# Patient Record
Sex: Female | Born: 1960 | Race: White | Hispanic: No | Smoking: Current every day smoker
Health system: Southern US, Community
[De-identification: ages and names within clinical notes are randomized; demographics above are authoritative.]

## PROBLEM LIST (undated history)

## (undated) DIAGNOSIS — F329 Major depressive disorder, single episode, unspecified: Secondary | ICD-10-CM

## (undated) DIAGNOSIS — R3129 Other microscopic hematuria: Secondary | ICD-10-CM

## (undated) DIAGNOSIS — D649 Anemia, unspecified: Secondary | ICD-10-CM

## (undated) DIAGNOSIS — B009 Herpesviral infection, unspecified: Secondary | ICD-10-CM

## (undated) DIAGNOSIS — F419 Anxiety disorder, unspecified: Secondary | ICD-10-CM

## (undated) DIAGNOSIS — F32A Depression, unspecified: Secondary | ICD-10-CM

## (undated) DIAGNOSIS — O24419 Gestational diabetes mellitus in pregnancy, unspecified control: Secondary | ICD-10-CM

## (undated) DIAGNOSIS — K219 Gastro-esophageal reflux disease without esophagitis: Secondary | ICD-10-CM

## (undated) DIAGNOSIS — J189 Pneumonia, unspecified organism: Secondary | ICD-10-CM

## (undated) HISTORY — DX: Major depressive disorder, single episode, unspecified: F32.9

## (undated) HISTORY — PX: WISDOM TOOTH EXTRACTION: SHX21

## (undated) HISTORY — DX: Depression, unspecified: F32.A

## (undated) HISTORY — DX: Gestational diabetes mellitus in pregnancy, unspecified control: O24.419

## (undated) HISTORY — DX: Other microscopic hematuria: R31.29

## (undated) HISTORY — DX: Herpesviral infection, unspecified: B00.9

---

## 1994-02-03 HISTORY — PX: CERVICAL BIOPSY: SHX590

## 1995-02-04 HISTORY — PX: OTHER SURGICAL HISTORY: SHX169

## 2002-11-23 ENCOUNTER — Other Ambulatory Visit: Admission: RE | Admit: 2002-11-23 | Discharge: 2002-11-23 | Payer: Self-pay | Admitting: Obstetrics and Gynecology

## 2002-12-21 ENCOUNTER — Ambulatory Visit (HOSPITAL_COMMUNITY): Admission: RE | Admit: 2002-12-21 | Discharge: 2002-12-21 | Payer: Self-pay | Admitting: Obstetrics and Gynecology

## 2003-12-12 ENCOUNTER — Other Ambulatory Visit: Admission: RE | Admit: 2003-12-12 | Discharge: 2003-12-12 | Payer: Self-pay | Admitting: Obstetrics and Gynecology

## 2004-01-02 ENCOUNTER — Ambulatory Visit (HOSPITAL_COMMUNITY): Admission: RE | Admit: 2004-01-02 | Discharge: 2004-01-02 | Payer: Self-pay | Admitting: Obstetrics and Gynecology

## 2005-01-15 ENCOUNTER — Other Ambulatory Visit: Admission: RE | Admit: 2005-01-15 | Discharge: 2005-01-15 | Payer: Self-pay | Admitting: Obstetrics and Gynecology

## 2005-02-07 ENCOUNTER — Ambulatory Visit (HOSPITAL_COMMUNITY): Admission: RE | Admit: 2005-02-07 | Discharge: 2005-02-07 | Payer: Self-pay | Admitting: Obstetrics and Gynecology

## 2006-02-03 HISTORY — PX: ABLATION: SHX5711

## 2006-02-11 ENCOUNTER — Ambulatory Visit (HOSPITAL_COMMUNITY): Admission: RE | Admit: 2006-02-11 | Discharge: 2006-02-11 | Payer: Self-pay | Admitting: Obstetrics & Gynecology

## 2006-03-26 ENCOUNTER — Other Ambulatory Visit: Admission: RE | Admit: 2006-03-26 | Discharge: 2006-03-26 | Payer: Self-pay | Admitting: Obstetrics & Gynecology

## 2006-05-25 ENCOUNTER — Ambulatory Visit (HOSPITAL_BASED_OUTPATIENT_CLINIC_OR_DEPARTMENT_OTHER): Admission: RE | Admit: 2006-05-25 | Discharge: 2006-05-25 | Payer: Self-pay | Admitting: Obstetrics & Gynecology

## 2006-05-25 HISTORY — PX: NOVASURE ABLATION: SHX5394

## 2007-04-02 ENCOUNTER — Ambulatory Visit (HOSPITAL_COMMUNITY): Admission: RE | Admit: 2007-04-02 | Discharge: 2007-04-02 | Payer: Self-pay | Admitting: Obstetrics & Gynecology

## 2007-10-29 ENCOUNTER — Other Ambulatory Visit: Admission: RE | Admit: 2007-10-29 | Discharge: 2007-10-29 | Payer: Self-pay | Admitting: Obstetrics & Gynecology

## 2008-04-03 ENCOUNTER — Ambulatory Visit (HOSPITAL_COMMUNITY): Admission: RE | Admit: 2008-04-03 | Discharge: 2008-04-03 | Payer: Self-pay | Admitting: Obstetrics & Gynecology

## 2009-05-04 ENCOUNTER — Ambulatory Visit (HOSPITAL_COMMUNITY): Admission: RE | Admit: 2009-05-04 | Discharge: 2009-05-04 | Payer: Self-pay | Admitting: Obstetrics & Gynecology

## 2009-06-03 DIAGNOSIS — B009 Herpesviral infection, unspecified: Secondary | ICD-10-CM

## 2009-06-03 HISTORY — DX: Herpesviral infection, unspecified: B00.9

## 2009-08-23 ENCOUNTER — Ambulatory Visit: Payer: Self-pay | Admitting: Diagnostic Radiology

## 2009-08-23 ENCOUNTER — Ambulatory Visit (HOSPITAL_BASED_OUTPATIENT_CLINIC_OR_DEPARTMENT_OTHER): Admission: RE | Admit: 2009-08-23 | Discharge: 2009-08-23 | Payer: Self-pay | Admitting: Family Medicine

## 2010-06-21 NOTE — Op Note (Signed)
[name], April Cook                ACCOUNT NO.:  [id number]   MEDICAL RECORD NO.:  [id number]          PATIENT TYPE:  AMB   LOCATION:  [facility]                         FACILITY:  [facility]   PHYSICIAN:  M. Leda Quail, MD  DATE OF BIRTH:  [birth year]   DATE OF PROCEDURE:  DATE OF DISCHARGE:                               OPERATIVE REPORT   PREOPERATIVE DIAGNOSES:  105. 50 year old gravida 2, para 2 married white female with      menorrhagia.  2. Preoperative endometrial biopsy which showed benign proliferative      endometrium.   POSTOPERATIVE DIAGNOSES:  73. 50 year old gravida 2, para 2 married white female with      menorrhagia.  2. Preoperative endometrial biopsy which showed benign proliferative      endometrium.   PROCEDURE:  NovaSure ablation.   SURGEON:  M. Leda Quail, M.D.   ASSISTANT:  OR staff.   ANESTHESIA:  MAC.   ANESTHESIOLOGIST:  Dr. Leta Jungling was the anesthesiologist overseeing the  case.   SPECIMENS:  None.   ESTIMATED BLOOD LOSS:  Minimal.   FLUIDS:  1000 mL of LR.   URINE OUTPUT:  50 mL.   DEFICIT:  10 mL of LR.   INDICATIONS:  Ms. April Cook is a 50 year old G2, P2 married white female  who has history of menorrhagia.  She has undergone a workup including a  sonohystogram and an endometrial biopsy.  Sonohystogram showed a 9.2 x  3.6 x 4.5-cm uterus with some small intramural fibroids.  She did have  endometrial biopsy which did not show any pathology.  She has been  counseled about options including birth control pills, cyclic  progestins, and endometrial ablation, Mirena IUD and hysterectomy.  She  initially decided to wait, but as her cycles continued to be heavy, she  has opted for an ablation and she is here for this today.   PROCEDURE:  The patient was taken to the OR.  She was placed in a supine  position.  Anesthesia was administered by the anesthesia staff without  difficulty.  Running IV was in place.  Informed consent was present on  the  chart.  Legs were positioned in the low lithotomy position in [place]  stirrups.  Abdomen, perineum, inner thighs, and vagina were prepped and  draped in normal sterile fashion.  A red rubber Foley catheter was used  to drain the bladder of all urine.  Legs were positioned and high  lithotomy position.   A bivalve speculum was placed in the vagina.  The anterior lip of the  cervix was grasped with a single-tooth tenaculum.  Before beginning the  procedure, paracervical block with 1% lidocaine mixed with epinephrine  (1:100,00 units) was instilled around the cervix.  This paracervical  block was instilled at the 3, 6, 9, and 12 o'clock positions with  specifically 2.5 mL of 1% lidocaine instilled to each location.  After  instillation, the cervix was then sounded with a sounding length of 7.5  cm.  The cervical length is 3.5 cm.  That makes the cavity length 4 cm.   Using [employer]  dilators, the cervix was dilated up to a #7.  Then using the  operative hysteroscope, the endometrial cavity was visualized.  The  tubal ostia were visualized.  No intracavitary lesions were noted.  Photo documentation was made.  The hysteroscope was removed.  At this  point, the cervix was dilated up to a #8.  The NovaSure apparatus was  obtained.  It was opened outside of the patient to ensure that the array  was working correctly.  At this point, with operator's hand on the front  handle and after the [place] dilator was removed, the NovaSure device was  placed through the cervical os and up to the fundus.  The cavity length  of 4.0 cm had already been set on the device.  The smoke evacuator valve  was tapped to make sure that it was open.  At this point, the array was  opened within the uterus and the device locked in place bringing the  front and back handles together.  The device was then seated by moving  the device up and down, to the right and left, and seating the device to  90 degrees to the right and left.   This was performed again.  The cavity  width was then 3.6.  At this point, the cervical seal was placed over  the cervix, and a cavity assessment test was performed.  This was passed  easily.  At this point, based on the settings placed in the device, the  power was at 79.  A second tap on the pedal started the ablation cycle  which lasted for 1 minute and 46 seconds.  At this point, the device was  unlocked and the array was closed.  The device was brought through the  cervical os and opened outside the patient.  There was some tissue on  the array which was consistent with a good ablation.   The operative hysteroscopy was obtained and the endometrial cavity  revisualized.  Good ablation was noted.  Photodocumentation was made.  The hysteroscope was then removed from the uterus.  The tenaculum was  removed.  There was no bleeding from the tenaculum site, and the  speculum was removed.  The Betadine was cleaned off the patient's skin,  and the legs were positioned back in the supine position.   Sponge, lap, and instrument counts were correct x2.  There were no  needles used on the field.  The patient was awakened from her  anesthesia and taken to the recovery room in stable condition.      Lum Keas, MD  Electronically Signed     MSM/MEDQ  D:  05/25/2006  T:  05/25/2006  Job:  [id number]

## 2010-07-10 ENCOUNTER — Other Ambulatory Visit: Payer: Self-pay | Admitting: Obstetrics & Gynecology

## 2010-07-10 DIAGNOSIS — Z1231 Encounter for screening mammogram for malignant neoplasm of breast: Secondary | ICD-10-CM

## 2010-07-19 ENCOUNTER — Ambulatory Visit (HOSPITAL_COMMUNITY)
Admission: RE | Admit: 2010-07-19 | Discharge: 2010-07-19 | Disposition: A | Discharge status: Home / Self Care | Payer: BC Managed Care – PPO | Source: Ambulatory Visit | Attending: Obstetrics & Gynecology | Admitting: Obstetrics & Gynecology

## 2010-07-19 DIAGNOSIS — Z1231 Encounter for screening mammogram for malignant neoplasm of breast: Secondary | ICD-10-CM | POA: Insufficient documentation

## 2010-07-23 ENCOUNTER — Other Ambulatory Visit: Payer: Self-pay | Admitting: Obstetrics & Gynecology

## 2010-07-23 DIAGNOSIS — R928 Other abnormal and inconclusive findings on diagnostic imaging of breast: Secondary | ICD-10-CM

## 2010-07-24 ENCOUNTER — Ambulatory Visit
Admission: RE | Admit: 2010-07-24 | Discharge: 2010-07-24 | Disposition: A | Discharge status: Home / Self Care | Payer: BC Managed Care – PPO | Source: Ambulatory Visit | Attending: Obstetrics & Gynecology | Admitting: Obstetrics & Gynecology

## 2010-07-24 DIAGNOSIS — R928 Other abnormal and inconclusive findings on diagnostic imaging of breast: Secondary | ICD-10-CM

## 2010-10-13 IMAGING — CT CT CHEST W/O CM
2 of 3 series · 15 of 36 positions shown, 18 images · non-contrast
Comparison: None.  No plain film correlate or report available.

CLINICAL DATA: Left chest pain.  Possible nodule seen on chest x-
ray left lower lobe.  No history of cancer.

CT CHEST WITHOUT CONTRAST
TECHNIQUE: Multidetector CT imaging of the chest was performed
following the standard protocol without IV contrast.

[Series 2: chest 5.0 b31f · axial · 0.63mm/px · z∈[-248,+12]mm · 12 of 62 slices shown, 15 images]
[im 5/62  mediastinal]
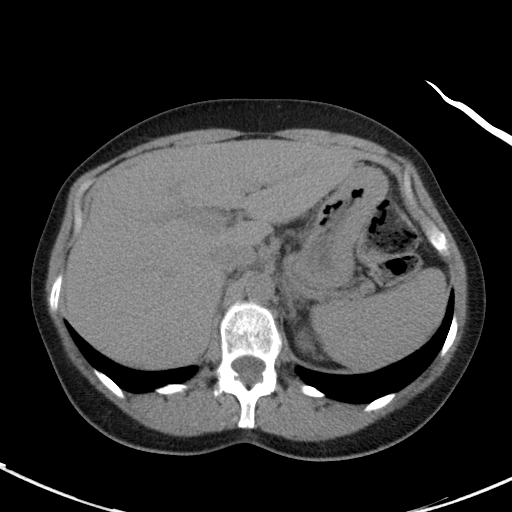
[im 5/62  lung]
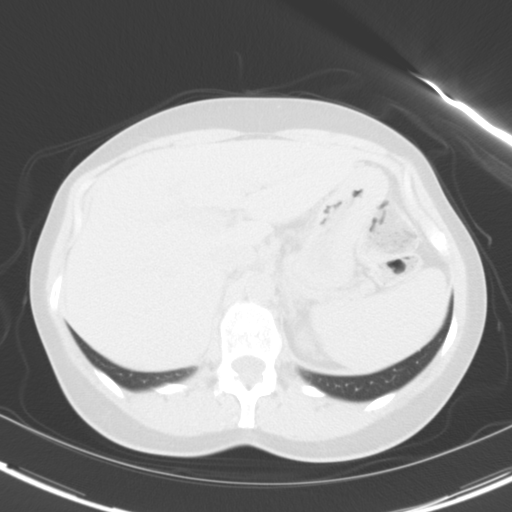
[im 10/62  lung]
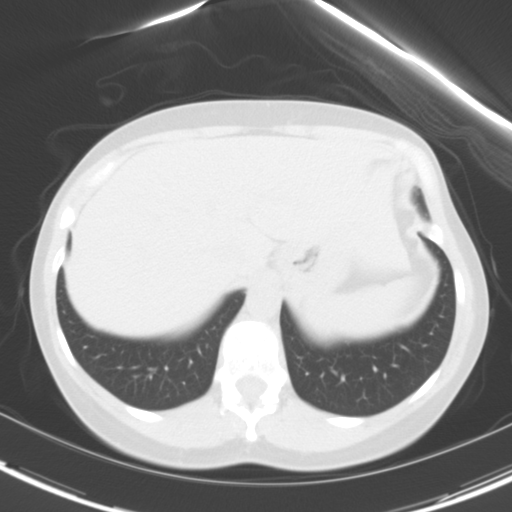
[im 14/62  lung]
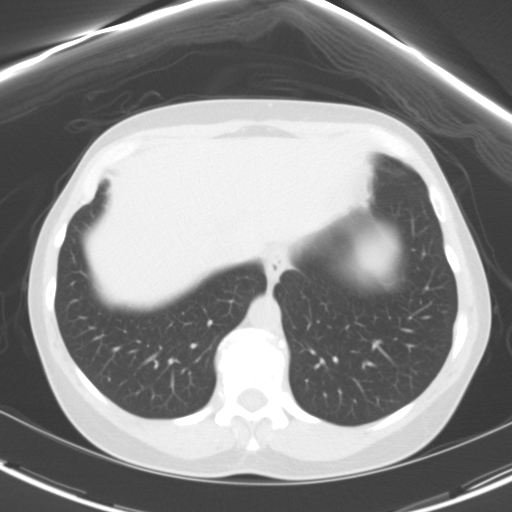
[im 19/62  lung]
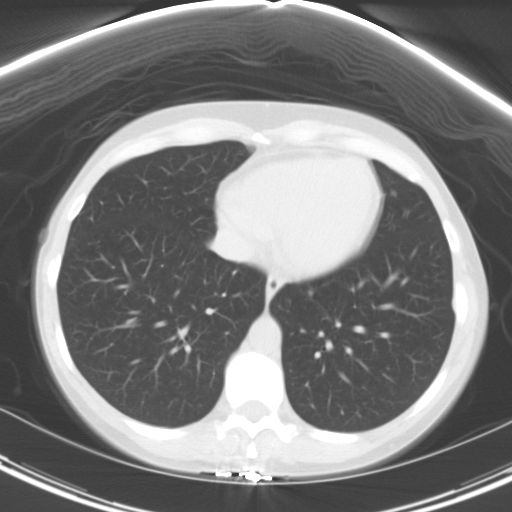
[im 23/62  mediastinal]
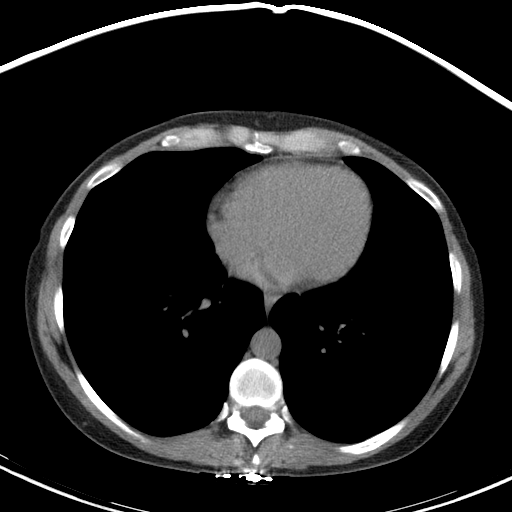
[im 23/62  lung]
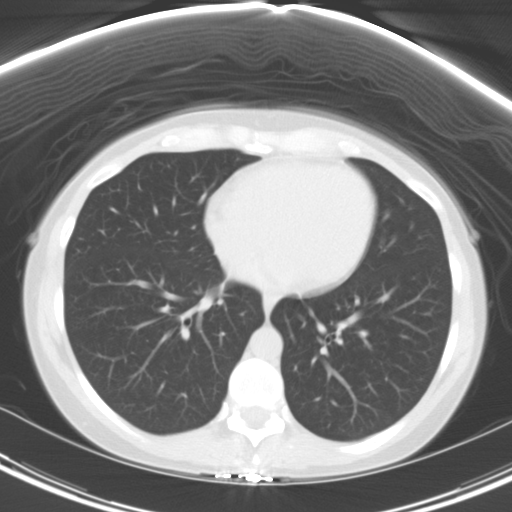
[im 28/62  lung]
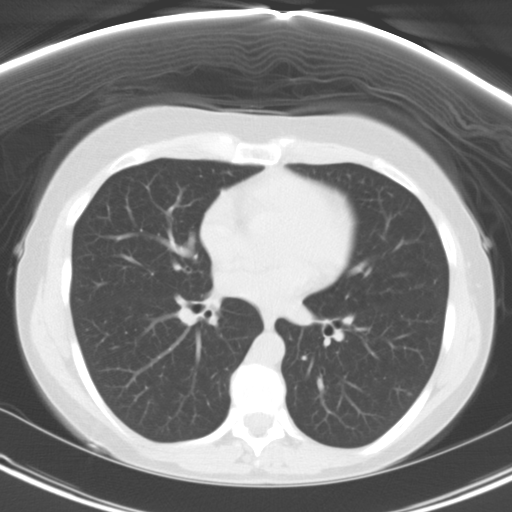
[im 34/62  lung]
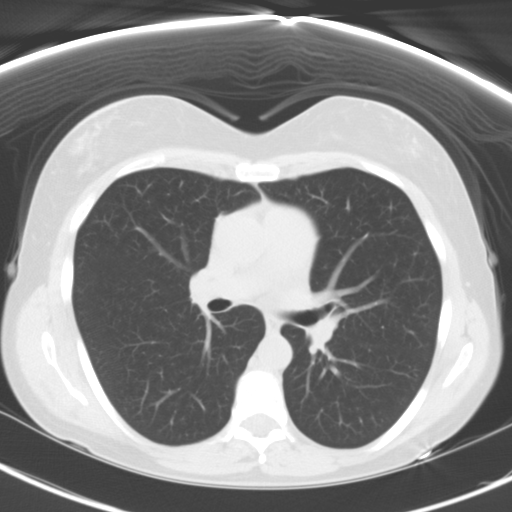
[im 39/62  lung]
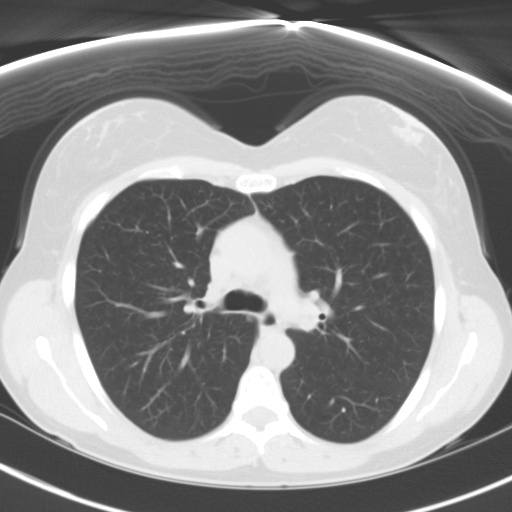
[im 43/62  mediastinal]
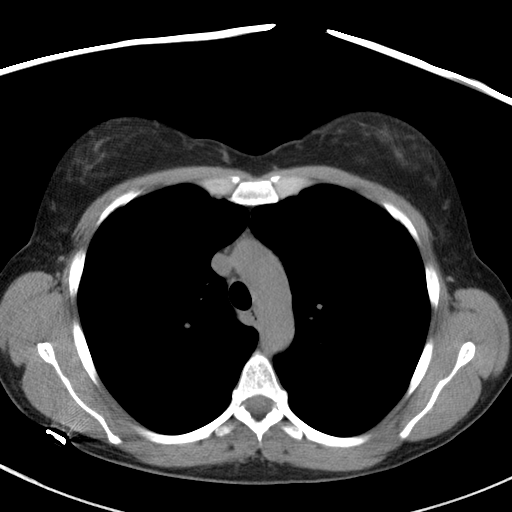
[im 43/62  lung]
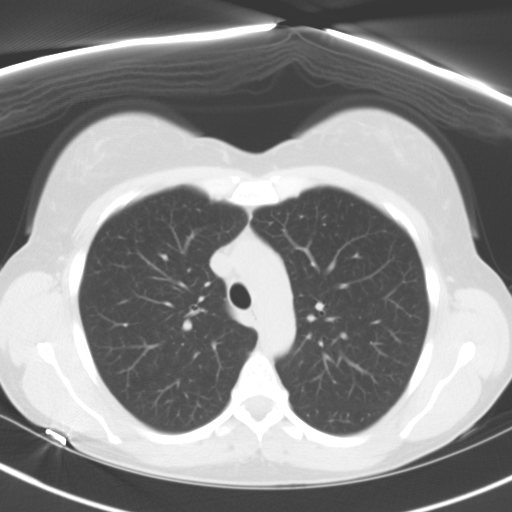
[im 48/62  lung]
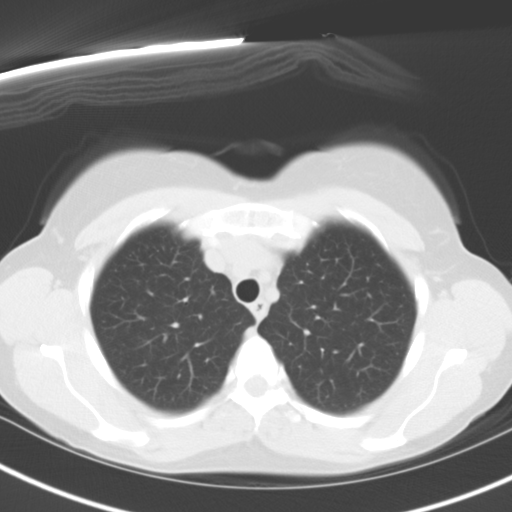
[im 52/62  lung]
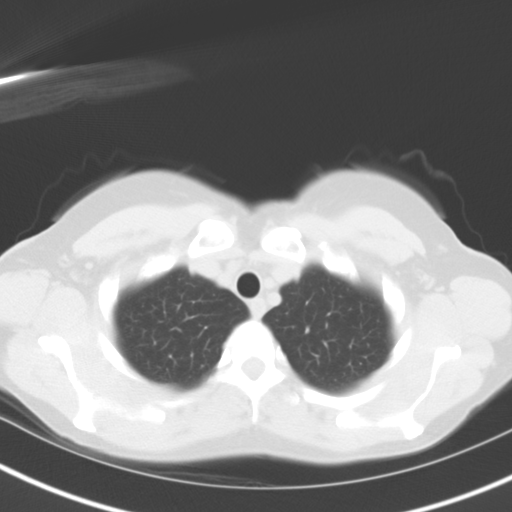
[im 57/62  lung]
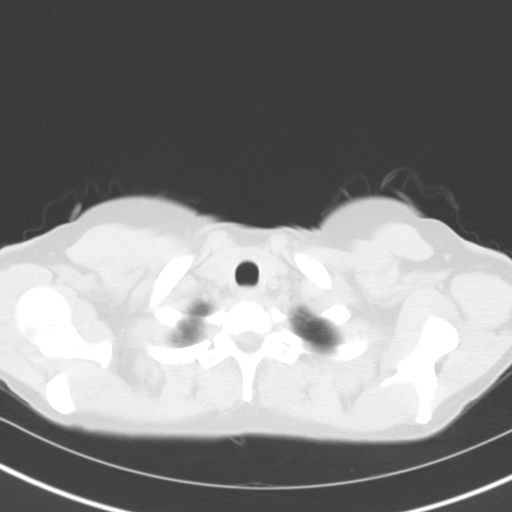

[Series 6: chest 3.0 coronal · coronal · 0.60mm/px · 3 of 82 slices shown]
[im 17/82  lung]
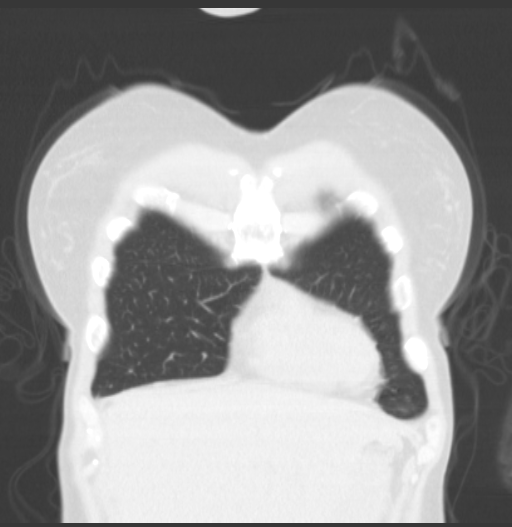
[im 33/82  lung]
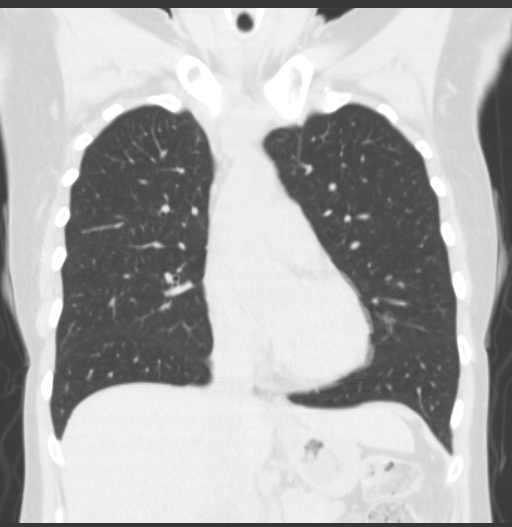
[im 49/82  lung]
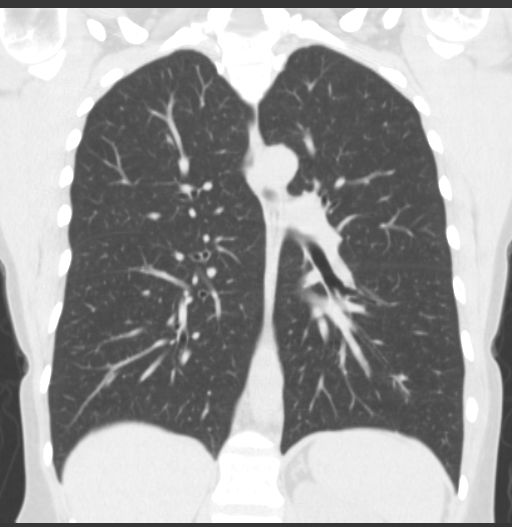

[15 of 36 positions shown; findings below may reference images not displayed]

FINDINGS: Lung windows demonstrate a probable subpleural 3 mm
nodule within the anterior right middle lobe on image 36 of series
3 and image 41 of sagittal.

No evidence of left-sided pulmonary nodule.

Soft tissue windows demonstrate normal heart size without
pericardial or pleural effusion.

No mediastinal or definite hilar adenopathy, given limitations of
unenhanced CT.  Subtle fluid level within the thoracic esophagus on
images 24 and 25.

Limited abdominal imaging demonstrates no significant findings.

Acute or subacute ninth posterolateral left rib fracture on image
49.  Healing posterolateral left eighth rib fracture on image 44.
Nonacute left anterolateral fifth rib fracture on image 31 with
overlying callus deposition.  Degenerative disc disease at the T11-
T12 level.
IMPRESSION: 1.  No evidence of left-sided lung nodule.
2.  Numerous left-sided rib fractures.  At least one of these
appears acute to subacute.  This may account for the patient's
chest pain and the plain film abnormality.
3.  Suspect a tiny right middle lobe lung nodule. If the patient is
at high risk for bronchogenic carcinoma, follow-up chest CT at 1
year is recommended.  If the patient is at low risk, no follow-up
is needed.   This recommendation follows the consensus statement:
"Guidelines for Management of Small Pulmonary Nodules Detected on
CT Scans:  A Statement from the [HOSPITAL]" as published in
[URL]
4.  Thoracic air fluid level may relate to esophageal dysmotility
or gastroesophageal reflux.

## 2011-09-26 ENCOUNTER — Other Ambulatory Visit: Payer: Self-pay | Admitting: Obstetrics & Gynecology

## 2011-09-26 DIAGNOSIS — Z1231 Encounter for screening mammogram for malignant neoplasm of breast: Secondary | ICD-10-CM

## 2011-10-13 ENCOUNTER — Ambulatory Visit (HOSPITAL_COMMUNITY)
Admission: RE | Admit: 2011-10-13 | Discharge: 2011-10-13 | Disposition: A | Discharge status: Home / Self Care | Payer: BC Managed Care – PPO | Source: Ambulatory Visit | Attending: Obstetrics & Gynecology | Admitting: Obstetrics & Gynecology

## 2011-10-13 DIAGNOSIS — Z1231 Encounter for screening mammogram for malignant neoplasm of breast: Secondary | ICD-10-CM | POA: Insufficient documentation

## 2012-08-10 ENCOUNTER — Encounter: Payer: Self-pay | Admitting: Obstetrics & Gynecology

## 2012-08-11 ENCOUNTER — Ambulatory Visit: Payer: Self-pay | Admitting: Obstetrics & Gynecology

## 2012-08-13 ENCOUNTER — Ambulatory Visit: Payer: Self-pay | Admitting: Obstetrics & Gynecology

## 2012-09-10 ENCOUNTER — Encounter: Payer: Self-pay | Admitting: Obstetrics & Gynecology

## 2012-09-10 ENCOUNTER — Ambulatory Visit (INDEPENDENT_AMBULATORY_CARE_PROVIDER_SITE_OTHER): Payer: BC Managed Care – PPO | Admitting: Obstetrics & Gynecology

## 2012-09-10 VITALS — BP 122/78 | HR 64 | Resp 16 | Ht 62.5 in | Wt 146.6 lb

## 2012-09-10 DIAGNOSIS — Z01419 Encounter for gynecological examination (general) (routine) without abnormal findings: Secondary | ICD-10-CM

## 2012-09-10 DIAGNOSIS — Z0000 Encounter for general adult medical examination without abnormal findings: Secondary | ICD-10-CM

## 2012-09-10 LAB — HEMOGLOBIN, FINGERSTICK: Hemoglobin, fingerstick: 16 g/dL (ref 12.0–16.0)

## 2012-09-10 MED ORDER — HYDROCORTISONE 2.5 % RE CREA: TOPICAL_CREAM | Freq: Two times a day (BID) | RECTAL | Status: DC

## 2012-09-10 NOTE — Patient Instructions (Signed)
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
## 2012-12-09 ENCOUNTER — Other Ambulatory Visit: Payer: Self-pay

## 2013-06-29 ENCOUNTER — Other Ambulatory Visit: Payer: Self-pay | Admitting: Obstetrics & Gynecology

## 2013-06-29 DIAGNOSIS — Z1231 Encounter for screening mammogram for malignant neoplasm of breast: Secondary | ICD-10-CM

## 2013-07-25 ENCOUNTER — Ambulatory Visit (HOSPITAL_COMMUNITY)
Admission: RE | Admit: 2013-07-25 | Discharge: 2013-07-25 | Disposition: A | Discharge status: Home / Self Care | Payer: BC Managed Care – PPO | Source: Ambulatory Visit | Attending: Obstetrics & Gynecology | Admitting: Obstetrics & Gynecology

## 2013-07-25 DIAGNOSIS — Z1231 Encounter for screening mammogram for malignant neoplasm of breast: Secondary | ICD-10-CM | POA: Insufficient documentation

## 2013-09-12 ENCOUNTER — Other Ambulatory Visit: Payer: Self-pay | Admitting: Obstetrics & Gynecology

## 2013-09-12 NOTE — Telephone Encounter (Signed)
Last AEX: 09/10/12 Last refill:09/10/12 #30g X 2 Current AEX:12/02/13  Please advise

## 2013-12-02 ENCOUNTER — Encounter: Payer: Self-pay | Admitting: Obstetrics & Gynecology

## 2013-12-02 ENCOUNTER — Ambulatory Visit (INDEPENDENT_AMBULATORY_CARE_PROVIDER_SITE_OTHER): Payer: BC Managed Care – PPO | Admitting: Obstetrics & Gynecology

## 2013-12-02 VITALS — BP 108/62 | HR 68 | Resp 16 | Ht 62.75 in | Wt 155.6 lb

## 2013-12-02 DIAGNOSIS — Z0000 Encounter for general adult medical examination without abnormal findings: Secondary | ICD-10-CM

## 2013-12-02 DIAGNOSIS — Z124 Encounter for screening for malignant neoplasm of cervix: Secondary | ICD-10-CM

## 2013-12-02 DIAGNOSIS — G4700 Insomnia, unspecified: Secondary | ICD-10-CM

## 2013-12-02 DIAGNOSIS — Z01419 Encounter for gynecological examination (general) (routine) without abnormal findings: Secondary | ICD-10-CM

## 2013-12-02 LAB — POCT URINALYSIS DIPSTICK
Bilirubin, UA: NEGATIVE
Glucose, UA: NEGATIVE
Ketones, UA: NEGATIVE
Leukocytes, UA: NEGATIVE
Nitrite, UA: NEGATIVE
Protein, UA: NEGATIVE
Urobilinogen, UA: NEGATIVE
pH, UA: 6.5

## 2013-12-02 LAB — HEMOGLOBIN, FINGERSTICK: Hemoglobin, fingerstick: 14.3 g/dL (ref 12.0–16.0)

## 2013-12-02 NOTE — Progress Notes (Signed)
[age] y.o. G2P2 DivorcedCaucasianF here for annual exam.  No vaginal bleeding.  Having some vaginal dryness.    Patient's last menstrual period was 05/25/2006.          Sexually active: Yes.    The current method of family planning is post menopausal status.    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 She has never used smokeless tobacco. She reports that she drinks about 7 ounces of alcohol per week. She reports that she does not use illicit drugs.  Past Medical History  Diagnosis Date  . Depression   . 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 Microscopic hematuria     h/o-negative UA  . Gestational diabetes     Past Surgical History  Procedure Laterality Date  . Resection of polyp  1997    cervical  . Novasure ablation  05/25/06  . Wisdom tooth extraction      Current Outpatient Prescriptions  Medication Sig Dispense Refill  . ALPRAZolam (XANAX) 0.25 MG tablet Take 0.25 mg by mouth as needed for sleep.      Marland Kitchen. ibuprofen (ADVIL,MOTRIN) 200 MG tablet Take 200 mg by mouth every 6 (six) hours as needed.      Marland Kitchen. KETOCONAZOLE EX Apply topically. shampoo      . PROCTOSOL HC 2.5 % rectal cream APPLY  CREAM RECTALLY TWICE DAILY AS NEEDED  30 g  1  . Ranitidine HCl (ZANTAC PO) Take by mouth as needed.      . sertraline (ZOLOFT) 100 MG tablet 100 mg daily.      . traZODone (DESYREL) 50 MG tablet 50 mg daily.       No current facility-administered medications for this visit.    Family History  Problem Relation Age of Onset  . Diabetes Maternal Grandmother   . Hypertension Mother   . Rectal cancer Mother 1779  . Hypertension Father   . Heart attack Father   . Hypertension  Brother   . Alzheimer's disease Father     ROS:  Pertinent items are noted in HPI.  Otherwise, a comprehensive ROS was negative.  Exam:   BP 108/62  Pulse 68  Resp 16  Ht 5' 2.75" (1.594 m)  Wt 155 lb 9.6 oz (70.58 kg)  BMI 27.78 kg/m2  LMP 05/25/2006  Weight change: +9#   Height: 5' 2.75" (159.4 cm)  Ht Readings from Last 3 Encounters:  12/02/13 5' 2.75" (1.594 m)  09/10/12 5' 2.5" (1.588 m)    General appearance: alert, cooperative and appears stated age Head: Normocephalic, without obvious abnormality, atraumatic Neck: no adenopathy, supple, symmetrical, trachea midline and thyroid normal to inspection and palpation Lungs: clear to auscultation bilaterally Breasts: normal appearance, no masses or tenderness Heart: regular rate and rhythm Abdomen: soft, non-tender; bowel sounds normal; no masses,  no organomegaly Extremities: extremities normal, atraumatic, no cyanosis or edema Skin: Skin color, texture, turgor normal. No rashes or lesions Lymph nodes: Cervical, supraclavicular, and axillary nodes normal. No abnormal inguinal nodes palpated Neurologic: Grossly normal   Pelvic: External genitalia:  no lesions              Urethra:  normal appearing urethra with no masses, tenderness or lesions              Bartholins and Skenes: normal                 Vagina: normal appearing vagina with normal color and discharge, no lesions              Cervix: no lesions              Pap taken: Yes.   Bimanual Exam:  Uterus:  normal size, contour, position, consistency, mobility, non-tender              Adnexa: normal adnexa and no mass, fullness, tenderness               Rectovaginal: Confirms               Anus:  normal sphincter tone, no lesions  A:  Well Woman with normal exam  PMP, no HRT  S/p HTA   Mother with hx of rectal cancer  H/o microscopic hematuria on dip with u/a's negative.  Only trace RBC today.  H/O HSV 2.  No further outbreaks after first one   P: Mammogram  yearly. D/W pt 3D MMG.  Neg with neg HR HPV 6/12. Pap today. Rx for Zostavax given.  Pt will check insurance coverage.  She has had chicken pox in the past. Labs next year. return annually or prn  An After Visit Summary was printed and given to the patient.

## 2013-12-05 ENCOUNTER — Encounter: Payer: Self-pay | Admitting: Obstetrics & Gynecology

## 2013-12-06 LAB — IPS PAP TEST WITH REFLEX TO HPV

## 2015-02-08 ENCOUNTER — Ambulatory Visit: Payer: BC Managed Care – PPO | Admitting: Obstetrics & Gynecology

## 2015-07-11 ENCOUNTER — Ambulatory Visit: Payer: Self-pay | Admitting: Obstetrics and Gynecology

## 2015-07-25 ENCOUNTER — Other Ambulatory Visit: Payer: Self-pay | Admitting: Obstetrics & Gynecology

## 2015-07-25 NOTE — Telephone Encounter (Signed)
Medication refill request: Proctosol HC 2.5% rectal cream Last AEX:  12/02/13 SM Next AEX: none Last MMG (if hormonal medication request): 07/26/13 [id number] neg Refill authorized: 09/14/13 #30g w/1refill; today please advise, patient has cancelled recent appointments.

## 2015-07-25 NOTE — Telephone Encounter (Signed)
rx declined.  Pt will need to be seen for any refills.

## 2015-07-26 ENCOUNTER — Telehealth: Payer: Self-pay | Admitting: *Deleted

## 2015-07-26 NOTE — Telephone Encounter (Signed)
error 

## 2015-07-26 NOTE — Telephone Encounter (Signed)
Tried calling patient, busy signal on house phone, no answer on mobile phone, left message to call back.

## 2015-07-26 NOTE — Telephone Encounter (Signed)
Patient called back and I gave her Dr. Rondel BatonMiller's message. Patient declined to make an appointment at this this.

## 2015-11-10 DIAGNOSIS — Z23 Encounter for immunization: Secondary | ICD-10-CM | POA: Diagnosis not present

## 2016-03-24 DIAGNOSIS — F321 Major depressive disorder, single episode, moderate: Secondary | ICD-10-CM | POA: Diagnosis not present

## 2016-06-23 ENCOUNTER — Ambulatory Visit (INDEPENDENT_AMBULATORY_CARE_PROVIDER_SITE_OTHER): Payer: BLUE CROSS/BLUE SHIELD | Admitting: Physician Assistant

## 2016-06-23 ENCOUNTER — Encounter: Payer: Self-pay | Admitting: Physician Assistant

## 2016-06-23 VITALS — BP 131/85 | HR 73 | Temp 97.9°F | Ht 62.75 in | Wt 160.2 lb

## 2016-06-23 DIAGNOSIS — F339 Major depressive disorder, recurrent, unspecified: Secondary | ICD-10-CM | POA: Insufficient documentation

## 2016-06-23 DIAGNOSIS — R5383 Other fatigue: Secondary | ICD-10-CM | POA: Diagnosis not present

## 2016-06-23 DIAGNOSIS — Z0000 Encounter for general adult medical examination without abnormal findings: Secondary | ICD-10-CM

## 2016-06-23 DIAGNOSIS — Z1159 Encounter for screening for other viral diseases: Secondary | ICD-10-CM

## 2016-06-23 NOTE — Progress Notes (Signed)
BP 131/85   Pulse 73   Temp 97.9 F (36.6 C) (Oral)   Ht 5' 2.75" (1.594 m)   Wt 160 lb 3.2 oz (72.7 kg)   BMI 28.60 kg/m    Subjective:    Patient ID: April Cook, female    DOB: May 12, 1960, 56 y.o.   MRN: [id number]  April Cook is a 56 y.o. female presenting on 06/23/2016 for Establish Care  HPI this is a new patient to our office today. She has lived in the area for about 5 years now overall she is quite healthy. She does see a psychiatrist for chronic depression and anxiety. Her medications are reviewed. She does take over-the-counter Zantac for her GERD. She uses it only rarely. She states she only uses ibuprofen rarely and 1 or 2 at a time when she has some pain. She really enjoys outside gardening and activities. 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  Past Medical History:  Diagnosis Date  . Depression   . Gestational diabetes   . 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 Microscopic hematuria    h/o-negative UA   Relevant past medical, surgical, family and social history reviewed and updated as indicated. Interim medical history since our last visit reviewed. Allergies and medications reviewed and updated.   Data reviewed from any sources in EPIC.  Review of Systems  Constitutional: Positive for fatigue. Negative for activity change, fever and unexpected weight change.  HENT: Negative.   Eyes: Negative.   Respiratory: Negative.  Negative for cough, shortness of breath and wheezing.   Cardiovascular: Negative.  Negative for chest pain, palpitations and leg swelling.  Gastrointestinal: Negative.  Negative for abdominal pain.  Endocrine: Negative.   Genitourinary: Negative.  Negative for dysuria.  Musculoskeletal: Negative.  Negative for arthralgias and joint swelling.  Skin: Negative.   Neurological: Negative.  Negative for syncope and headaches.     Social History   Social History  . Marital status: Divorced    Spouse name: N/A  . Number of children: N/A  . Years of  education: N/A   Occupational History  . Not on file.   Social History Main Topics  . Smoking status: Never Smoker  . Smokeless tobacco: Never Used     Comment: lives with a smoker  . Alcohol use 7.0 oz/week    14 Standard drinks or equivalent per week  . Drug use: No  . Sexual activity: Yes    Partners: Male   Other Topics Concern  . Not on file   Social History Narrative  . No narrative on file    Past Surgical History:  Procedure Laterality Date  . NOVASURE ABLATION  05/25/06  . resection of polyp  1997   cervical  . WISDOM TOOTH EXTRACTION      Family History  Problem Relation Age of Onset  . Hypertension Father   . Heart attack Father   . Alzheimer's disease Father   . Diabetes Maternal Grandmother   . Hypertension Mother   . Rectal cancer Mother 54  . Hypertension Brother     Allergies as of 06/23/2016   No Known Allergies     Medication List       Accurate as of 06/23/16 12:48 PM. Always use your most recent med list.          ibuprofen 200 MG tablet Commonly known as:  ADVIL,MOTRIN Take 200 mg by mouth every 6 (six) hours as needed.   LORazepam 0.5 MG tablet Commonly  known as:  ATIVAN Take 1-2 tablets by mouth daily as needed.   sertraline 100 MG tablet Commonly known as:  ZOLOFT 100 mg daily.   traZODone 50 MG tablet Commonly known as:  DESYREL 50 mg daily.   ZANTAC PO Take by mouth as needed.          Objective:    BP 131/85   Pulse 73   Temp 97.9 F (36.6 C) (Oral)   Ht 5' 2.75" (1.594 m)   Wt 160 lb 3.2 oz (72.7 kg)   BMI 28.60 kg/m   No Known Allergies Wt Readings from Last 3 Encounters:  06/23/16 160 lb 3.2 oz (72.7 kg)  12/02/13 155 lb 9.6 oz (70.6 kg)  09/10/12 146 lb 9.6 oz (66.5 kg)    Physical Exam  Constitutional: She is oriented to person, place, and time. She appears well-developed and well-nourished.  HENT:  Head: Normocephalic and atraumatic.  Right Ear: Tympanic membrane, external ear and ear canal  normal.  Left Ear: Tympanic membrane, external ear and ear canal normal.  Nose: Nose normal. No rhinorrhea.  Mouth/Throat: Oropharynx is clear and moist and mucous membranes are normal. No oropharyngeal exudate or posterior oropharyngeal erythema.  Eyes: Conjunctivae and EOM are normal. Pupils are equal, round, and reactive to light.  Neck: Normal range of motion. Neck supple.  Cardiovascular: Normal rate, regular rhythm, normal heart sounds and intact distal pulses.   Pulmonary/Chest: Effort normal and breath sounds normal.  Abdominal: Soft. Bowel sounds are normal.  Neurological: She is alert and oriented to person, place, and time. She has normal reflexes.  Skin: Skin is warm and dry. No rash noted.  Psychiatric: She has a normal mood and affect. Her behavior is normal. Judgment and thought content normal.  Nursing note and vitals reviewed.       Assessment & Plan:   1. Other fatigue - CBC with Differential/Platelet - Lipid panel - CMP14+EGFR - TSH - Hepatitis C antibody  2. Depression, recurrent ([place])  3. Well adult exam - Lipid panel - CMP14+EGFR - TSH - Hepatitis C antibody  4. Encounter for hepatitis C screening test for low risk patient - Hepatitis C antibody   Continue all other maintenance medications as listed above. Educational handout given for health main  Follow up plan: Follow-up as needed or worsening of symptoms. Call office for any issues.  Terald Sleeper PA-C [facility] [address]  [place], [place] [address] [phone]   06/23/2016, 12:48 PM

## 2016-06-23 NOTE — Patient Instructions (Signed)
Health Maintenance, Female Adopting a healthy lifestyle and getting preventive care can go a long way to promote health and wellness. Talk with your health care provider about what schedule of regular examinations is right for you. This is a good chance for you to check in with your provider about disease prevention and staying healthy. In between checkups, there are plenty of things you can do on your own. Experts have done a lot of research about which lifestyle changes and preventive measures are most likely to keep you healthy. Ask your health care provider for more information. Weight and diet Eat a healthy diet  Be sure to include plenty of vegetables, fruits, low-fat dairy products, and lean protein.  Do not eat a lot of foods high in solid fats, added sugars, or salt.  Get regular exercise. This is one of the most important things you can do for your health.  Most adults should exercise for at least 150 minutes each week. The exercise should increase your heart rate and make you sweat (moderate-intensity exercise).  Most adults should also do strengthening exercises at least twice a week. This is in addition to the moderate-intensity exercise. Maintain a healthy weight  Body mass index (BMI) is a measurement that can be used to identify possible weight problems. It estimates body fat based on height and weight. Your health care provider can help determine your BMI and help you achieve or maintain a healthy weight.  For females 76 years of age and older:  A BMI below 18.5 is considered underweight.  A BMI of 18.5 to 24.9 is normal.  A BMI of 25 to 29.9 is considered overweight.  A BMI of 30 and above is considered obese. Watch levels of cholesterol and blood lipids  You should start having your blood tested for lipids and cholesterol at 56 years of age, then have this test every 5 years.  You may need to have your cholesterol levels checked more often if:  Your lipid or  cholesterol levels are high.  You are older than 56 years of age.  You are at high risk for heart disease. Cancer screening Lung Cancer  Lung cancer screening is recommended for adults 64-42 years old who are at high risk for lung cancer because of a history of smoking.  A yearly low-dose CT scan of the lungs is recommended for people who:  Currently smoke.  Have quit within the past 15 years.  Have at least a 30-pack-year history of smoking. A pack year is smoking an average of one pack of cigarettes a day for 1 year.  Yearly screening should continue until it has been 15 years since you quit.  Yearly screening should stop if you develop a health problem that would prevent you from having lung cancer treatment. Breast Cancer  Practice breast self-awareness. This means understanding how your breasts normally appear and feel.  It also means doing regular breast self-exams. Let your health care provider know about any changes, no matter how small.  If you are in your 20s or 30s, you should have a clinical breast exam (CBE) by a health care provider every 1-3 years as part of a regular health exam.  If you are 34 or older, have a CBE every year. Also consider having a breast X-ray (mammogram) every year.  If you have a family history of breast cancer, talk to your health care provider about genetic screening.  If you are at high risk for breast cancer, talk  to your health care provider about having an MRI and a mammogram every year.  Breast cancer gene (BRCA) assessment is recommended for women who have family members with BRCA-related cancers. BRCA-related cancers include:  Breast.  Ovarian.  Tubal.  Peritoneal cancers.  Results of the assessment will determine the need for genetic counseling and BRCA1 and BRCA2 testing. Cervical Cancer  Your health care provider may recommend that you be screened regularly for cancer of the pelvic organs (ovaries, uterus, and vagina).  This screening involves a pelvic examination, including checking for microscopic changes to the surface of your cervix (Pap test). You may be encouraged to have this screening done every 3 years, beginning at age 24.  For women ages 66-65, health care providers may recommend pelvic exams and Pap testing every 3 years, or they may recommend the Pap and pelvic exam, combined with testing for human papilloma virus (HPV), every 5 years. Some types of HPV increase your risk of cervical cancer. Testing for HPV may also be done on women of any age with unclear Pap test results.  Other health care providers may not recommend any screening for nonpregnant women who are considered low risk for pelvic cancer and who do not have symptoms. Ask your health care provider if a screening pelvic exam is right for you.  If you have had past treatment for cervical cancer or a condition that could lead to cancer, you need Pap tests and screening for cancer for at least 20 years after your treatment. If Pap tests have been discontinued, your risk factors (such as having a new sexual partner) need to be reassessed to determine if screening should resume. Some women have medical problems that increase the chance of getting cervical cancer. In these cases, your health care provider may recommend more frequent screening and Pap tests. Colorectal Cancer  This type of cancer can be detected and often prevented.  Routine colorectal cancer screening usually begins at 56 years of age and continues through 56 years of age.  Your health care provider may recommend screening at an earlier age if you have risk factors for colon cancer.  Your health care provider may also recommend using home test kits to check for hidden blood in the stool.  A small camera at the end of a tube can be used to examine your colon directly (sigmoidoscopy or colonoscopy). This is done to check for the earliest forms of colorectal cancer.  Routine  screening usually begins at age 41.  Direct examination of the colon should be repeated every 5-10 years through 56 years of age. However, you may need to be screened more often if early forms of precancerous polyps or small growths are found. Skin Cancer  Check your skin from head to toe regularly.  Tell your health care provider about any new moles or changes in moles, especially if there is a change in a mole's shape or color.  Also tell your health care provider if you have a mole that is larger than the size of a pencil eraser.  Always use sunscreen. Apply sunscreen liberally and repeatedly throughout the day.  Protect yourself by wearing long sleeves, pants, a wide-brimmed hat, and sunglasses whenever you are outside. Heart disease, diabetes, and high blood pressure  High blood pressure causes heart disease and increases the risk of stroke. High blood pressure is more likely to develop in:  People who have blood pressure in the high end of the normal range (130-139/85-89 mm Hg).  People who are overweight or obese.  People who are African American.  If you are 59-24 years of age, have your blood pressure checked every 3-5 years. If you are 34 years of age or older, have your blood pressure checked every year. You should have your blood pressure measured twice-once when you are at a hospital or clinic, and once when you are not at a hospital or clinic. Record the average of the two measurements. To check your blood pressure when you are not at a hospital or clinic, you can use:  An automated blood pressure machine at a pharmacy.  A home blood pressure monitor.  If you are between 29 years and 60 years old, ask your health care provider if you should take aspirin to prevent strokes.  Have regular diabetes screenings. This involves taking a blood sample to check your fasting blood sugar level.  If you are at a normal weight and have a low risk for diabetes, have this test once  every three years after 56 years of age.  If you are overweight and have a high risk for diabetes, consider being tested at a younger age or more often. Preventing infection Hepatitis B  If you have a higher risk for hepatitis B, you should be screened for this virus. You are considered at high risk for hepatitis B if:  You were born in a country where hepatitis B is common. Ask your health care provider which countries are considered high risk.  Your parents were born in a high-risk country, and you have not been immunized against hepatitis B (hepatitis B vaccine).  You have HIV or AIDS.  You use needles to inject street drugs.  You live with someone who has hepatitis B.  You have had sex with someone who has hepatitis B.  You get hemodialysis treatment.  You take certain medicines for conditions, including cancer, organ transplantation, and autoimmune conditions. Hepatitis C  Blood testing is recommended for:  Everyone born from [birth year] through 1965.  Anyone with known risk factors for hepatitis C. Sexually transmitted infections (STIs)  You should be screened for sexually transmitted infections (STIs) including gonorrhea and chlamydia if:  You are sexually active and are younger than 56 years of age.  You are older than 56 years of age and your health care provider tells you that you are at risk for this type of infection.  Your sexual activity has changed since you were last screened and you are at an increased risk for chlamydia or gonorrhea. Ask your health care provider if you are at risk.  If you do not have HIV, but are at risk, it may be recommended that you take a prescription medicine daily to prevent HIV infection. This is called pre-exposure prophylaxis (PrEP). You are considered at risk if:  You are sexually active and do not regularly use condoms or know the HIV status of your partner(s).  You take drugs by injection.  You are sexually active with a partner  who has HIV. Talk with your health care provider about whether you are at high risk of being infected with HIV. If you choose to begin PrEP, you should first be tested for HIV. You should then be tested every 3 months for as long as you are taking PrEP. Pregnancy  If you are premenopausal and you may become pregnant, ask your health care provider about preconception counseling.  If you may become pregnant, take 400 to 800 micrograms (mcg) of folic acid  every day.  If you want to prevent pregnancy, talk to your health care provider about birth control (contraception). Osteoporosis and menopause  Osteoporosis is a disease in which the bones lose minerals and strength with aging. This can result in serious bone fractures. Your risk for osteoporosis can be identified using a bone density scan.  If you are 4 years of age or older, or if you are at risk for osteoporosis and fractures, ask your health care provider if you should be screened.  Ask your health care provider whether you should take a calcium or vitamin D supplement to lower your risk for osteoporosis.  Menopause may have certain physical symptoms and risks.  Hormone replacement therapy may reduce some of these symptoms and risks. Talk to your health care provider about whether hormone replacement therapy is right for you. Follow these instructions at home:  Schedule regular health, dental, and eye exams.  Stay current with your immunizations.  Do not use any tobacco products including cigarettes, chewing tobacco, or electronic cigarettes.  If you are pregnant, do not drink alcohol.  If you are breastfeeding, limit how much and how often you drink alcohol.  Limit alcohol intake to no more than 1 drink per day for nonpregnant women. One drink equals 12 ounces of beer, 5 ounces of wine, or 1 ounces of hard liquor.  Do not use street drugs.  Do not share needles.  Ask your health care provider for help if you need support  or information about quitting drugs.  Tell your health care provider if you often feel depressed.  Tell your health care provider if you have ever been abused or do not feel safe at home. This information is not intended to replace advice given to you by your health care provider. Make sure you discuss any questions you have with your health care provider. Document Released: 08/05/2010 Document Revised: 06/28/2015 Document Reviewed: 10/24/2014 Elsevier Interactive Patient Education  2017 [employer].

## 2016-06-24 LAB — CMP14+EGFR
ALT: 19 IU/L (ref 0–32)
AST: 21 IU/L (ref 0–40)
Albumin/Globulin Ratio: 2.6 — ABNORMAL HIGH (ref 1.2–2.2)
Albumin: 5.1 g/dL (ref 3.5–5.5)
Alkaline Phosphatase: 117 IU/L (ref 39–117)
BUN/Creatinine Ratio: 20 (ref 9–23)
BUN: 17 mg/dL (ref 6–24)
Bilirubin Total: 0.5 mg/dL (ref 0.0–1.2)
CO2: 22 mmol/L (ref 18–29)
Calcium: 9.8 mg/dL (ref 8.7–10.2)
Chloride: 100 mmol/L (ref 96–106)
Creatinine, Ser: 0.83 mg/dL (ref 0.57–1.00)
GFR calc Af Amer: 92 mL/min/{1.73_m2} (ref >59)
GFR calc non Af Amer: 80 mL/min/{1.73_m2} (ref >59)
Globulin, Total: 2 g/dL (ref 1.5–4.5)
Glucose: 109 mg/dL — ABNORMAL HIGH (ref 65–99)
Potassium: 4.7 mmol/L (ref 3.5–5.2)
Sodium: 139 mmol/L (ref 134–144)
Total Protein: 7.1 g/dL (ref 6.0–8.5)

## 2016-06-24 LAB — CBC WITH DIFFERENTIAL/PLATELET
Basophils Absolute: 0.1 10*3/uL (ref 0.0–0.2)
Basos: 1 %
EOS (ABSOLUTE): 0.1 10*3/uL (ref 0.0–0.4)
Eos: 2 %
Hematocrit: 45.2 % (ref 34.0–46.6)
Hemoglobin: 15.2 g/dL (ref 11.1–15.9)
Immature Grans (Abs): 0 10*3/uL (ref 0.0–0.1)
Immature Granulocytes: 0 %
Lymphocytes Absolute: 2.6 10*3/uL (ref 0.7–3.1)
Lymphs: 32 %
MCH: 31.3 pg (ref 26.6–33.0)
MCHC: 33.6 g/dL (ref 31.5–35.7)
MCV: 93 fL (ref 79–97)
Monocytes Absolute: 0.5 10*3/uL (ref 0.1–0.9)
Monocytes: 7 %
Neutrophils Absolute: 4.9 10*3/uL (ref 1.4–7.0)
Neutrophils: 58 %
Platelets: 320 10*3/uL (ref 150–379)
RBC: 4.85 x10E6/uL (ref 3.77–5.28)
RDW: 14.1 % (ref 12.3–15.4)
WBC: 8.2 10*3/uL (ref 3.4–10.8)

## 2016-06-24 LAB — LIPID PANEL
Chol/HDL Ratio: 3.7 ratio (ref 0.0–4.4)
Cholesterol, Total: 234 mg/dL — ABNORMAL HIGH (ref 100–199)
HDL: 64 mg/dL (ref >39)
LDL Calculated: 145 mg/dL — ABNORMAL HIGH (ref 0–99)
Triglycerides: 125 mg/dL (ref 0–149)
VLDL Cholesterol Cal: 25 mg/dL (ref 5–40)

## 2016-06-24 LAB — HEPATITIS C ANTIBODY

## 2016-06-24 LAB — TSH: TSH: 2.17 u[IU]/mL (ref 0.450–4.500)

## 2016-09-12 DIAGNOSIS — Z1231 Encounter for screening mammogram for malignant neoplasm of breast: Secondary | ICD-10-CM | POA: Diagnosis not present

## 2017-02-08 DIAGNOSIS — Z6830 Body mass index (BMI) 30.0-30.9, adult: Secondary | ICD-10-CM | POA: Diagnosis not present

## 2017-02-08 DIAGNOSIS — M546 Pain in thoracic spine: Secondary | ICD-10-CM | POA: Diagnosis not present

## 2017-02-08 DIAGNOSIS — R0781 Pleurodynia: Secondary | ICD-10-CM | POA: Diagnosis not present

## 2017-03-24 DIAGNOSIS — F321 Major depressive disorder, single episode, moderate: Secondary | ICD-10-CM | POA: Diagnosis not present

## 2017-04-16 ENCOUNTER — Encounter: Payer: Self-pay | Admitting: Nurse Practitioner

## 2017-04-16 ENCOUNTER — Ambulatory Visit: Payer: BLUE CROSS/BLUE SHIELD | Admitting: Nurse Practitioner

## 2017-04-16 VITALS — BP 131/82 | HR 92 | Temp 98.1°F | Ht 62.0 in | Wt 160.0 lb

## 2017-04-16 DIAGNOSIS — S51812A Laceration without foreign body of left forearm, initial encounter: Secondary | ICD-10-CM | POA: Diagnosis not present

## 2017-04-16 MED ORDER — CEPHALEXIN 500 MG PO CAPS: 500.0000 mg | ORAL_CAPSULE | Freq: Three times a day (TID) | ORAL | 0 refills | Status: DC

## 2017-04-16 NOTE — Progress Notes (Signed)
   Subjective:    Patient ID: April Cook, female    DOB: 02/19/1960, 57 y.o.   MRN: [id number]  HPI Patient comes in with laceration to left forearm. She was cleaning rabbits and accidentally stabe herself with a knife.     Review of Systems  Skin:       Laceration to left forearm  All other systems reviewed and are negative.      Objective:   Physical Exam  Constitutional: She is oriented to person, place, and time. She appears well-developed and well-nourished. No distress.  Cardiovascular: Normal rate and regular rhythm.  Pulmonary/Chest: Effort normal and breath sounds normal.  Neurological: She is alert and oriented to person, place, and time.  Skin: Skin is warm.  3cm laceration to left forearm- wound edges smooth   PROCEDURE  Consent:    Consent obtained:  verbal frompatient   Consent given by:  patient   Risks discussed:  potential for infection   Alternatives discussed:  none Wound    Type:  laceration   Size:  3cm   Location: left forearm Pre-procedure details:    Skin preparation:  betadine Anesthesia     Anesthesia method:  local   Local anesthetic: lidocaine 1% with epi 3 ml Procedure type:    Complexity:  simpe Procedure details:    Incision types:  none   Scalpel blade:  none   Wound management:  N/A   Drainage appearance:  N/A   Drainage amount:  none   Wound treatment:  4 simple interrupted 3-0 ethilon   Packing materials: n/A Post-procedure details:    Dressing: antibiolic ointment- 4x4 and coban   Patient tolerance of procedure:  well        Assessment & Plan:   1. Laceration of left forearm, initial encounter    Meds ordered this encounter  Medications  . cephALEXin (KEFLEX) 500 MG capsule    Sig: Take 1 capsule (500 mg total) by mouth 3 (three) times daily.    Dispense:  21 capsule    Refill:  0    Order Specific Question:   Supervising Provider    Answer:   Johna SheriffVINCENT, CAROL L [[id number]]   Watch for signs of infection suture  removal in 10 days  Mary-Margaret Daphine DeutscherMartin, FNP

## 2017-04-16 NOTE — Patient Instructions (Signed)
Sutured Wound Care Sutures are stitches that can be used to close wounds. Taking care of your wound properly can help prevent pain and infection. It can also help your wound to heal more quickly. How is this treated? Wound Care  Keep the wound clean and dry.  If you were given a bandage (dressing), change it at least one time per day or as told by your doctor. You should also change it if it gets wet or dirty.  Keep the wound completely dry for the first 24 hours or as told by your doctor. After that time, you may shower or bathe. However, make sure that the wound is not soaked in water until the sutures have been removed.  Clean the wound one time each day or as told by your doctor. ? Wash the wound with soap and water. ? Rinse the wound with water to remove all soap. ? Pat the wound dry with a clean towel. Do not rub the wound.  After cleaning the wound, put a thin layer of antibiotic ointment on it as told your doctor. This ointment: ? Helps to prevent infection. ? Keeps the bandage from sticking to the wound.  Have the sutures removed as told by your doctor. General Instructions  Take or apply medicines only as told by your doctor.  To help prevent scarring, make sure to cover your wound with sunscreen whenever you are outside after the sutures are removed and the wound is healed. Make sure to wear a sunscreen of at least 30 SPF.  If you were prescribed an antibiotic medicine or ointment, finish all of it even if you start to feel better.  Do not scratch or pick at the wound.  Keep all follow-up visits as told by your doctor. This is important.  Check your wound every day for signs of infection. Watch for: ? Redness, swelling, or pain. ? Fluid, blood, or pus.  Raise (elevate) the injured area above the level of your heart while you are sitting or lying down, if possible.  Avoid stretching your wound.  Drink enough fluids to keep your pee (urine) clear or pale  yellow. Contact a doctor if:  You were given a tetanus shot and you have any of these where the needle went in: ? Swelling. ? Very bad pain. ? Redness. ? Bleeding.  You have a fever.  A wound that was closed breaks open.  You notice a bad smell coming from the wound.  You notice something coming out of the wound, such as wood or glass.  Medicine does not help your pain.  You have any of these at the site of the wound. ? More redness. ? More swelling. ? More pain.  You have any of these coming from the wound. ? Fluid. ? Blood. ? Pus.  You notice a change in the color of your skin near the wound.  You need to change the bandage often due to fluid, blood, or pus coming from the wound.  You have a new rash.  You have numbness around the wound. Get help right away if:  You have very bad swelling around the wound.  Your pain suddenly gets worse and is very bad.  You have painful lumps near the wound or on skin that is anywhere on your body.  You have a red streak going away from the wound.  The wound is on your hand or foot and you cannot move a finger or toe like normal.  The   wound is on your hand or foot and you notice that your fingers or toes look pale or bluish. This information is not intended to replace advice given to you by your health care provider. Make sure you discuss any questions you have with your health care provider. Document Released: 07/09/2007 Document Revised: 06/28/2015 Document Reviewed: 09/01/2012 Elsevier Interactive Patient Education  2017 [employer]  

## 2017-04-28 ENCOUNTER — Encounter: Payer: Self-pay | Admitting: Nurse Practitioner

## 2017-04-28 ENCOUNTER — Ambulatory Visit (INDEPENDENT_AMBULATORY_CARE_PROVIDER_SITE_OTHER): Payer: BLUE CROSS/BLUE SHIELD | Admitting: Nurse Practitioner

## 2017-04-28 VITALS — BP 119/77 | HR 67 | Temp 98.2°F | Ht 62.0 in | Wt 166.8 lb

## 2017-04-28 DIAGNOSIS — Z4802 Encounter for removal of sutures: Secondary | ICD-10-CM

## 2017-04-28 NOTE — Progress Notes (Signed)
   Subjective:    Patient ID: April Cook, female    DOB: 02/20/1960, 57 y.o.   MRN: [id number]  HPI patient was seen on 04/16/17 with laceration to left forearm. 4 sutures were inserted and se was given keflex to prevent infection because the knife that cut her was dirty. She is here today for stitch removal.    Review of Systems  Constitutional: Negative for activity change and appetite change.  HENT: Negative.   Eyes: Negative for pain.  Respiratory: Negative for shortness of breath.   Cardiovascular: Negative for chest pain, palpitations and leg swelling.  Gastrointestinal: Negative for abdominal pain.  Endocrine: Negative for polydipsia.  Genitourinary: Negative.   Skin: Positive for wound (left forearm). Negative for rash.  Neurological: Negative for dizziness, weakness and headaches.  Hematological: Does not bruise/bleed easily.  Psychiatric/Behavioral: Negative.   All other systems reviewed and are negative.      Objective:   Physical Exam  Constitutional: She is oriented to person, place, and time. She appears well-developed and well-nourished. No distress.  Cardiovascular: Normal rate and regular rhythm.  Pulmonary/Chest: Effort normal and breath sounds normal.  Neurological: She is alert and oriented to person, place, and time.  Skin:  Wound edges well approximated- no signs of infection  Psychiatric: She has a normal mood and affect. Her behavior is normal. Judgment and thought content normal.   BP 119/77   Pulse 67   Temp 98.2 F (36.8 C) (Oral)   Ht 5\' 2"  (1.575 m)   Wt 166 lb 12.8 oz (75.7 kg)   BMI 30.51 kg/m   4 sutures removed and steristrips applied       Assessment & Plan:   1. Visit for suture removal    steristrip care discussed  Mary-Margaret Daphine DeutscherMartin, FNP

## 2017-09-01 DIAGNOSIS — F321 Major depressive disorder, single episode, moderate: Secondary | ICD-10-CM | POA: Diagnosis not present

## 2017-10-03 ENCOUNTER — Emergency Department (HOSPITAL_COMMUNITY): Payer: BLUE CROSS/BLUE SHIELD

## 2017-10-03 ENCOUNTER — Emergency Department (HOSPITAL_COMMUNITY)
Admission: EM | Admit: 2017-10-03 | Discharge: 2017-10-03 | Disposition: A | Discharge status: Home / Self Care | Payer: BLUE CROSS/BLUE SHIELD | Attending: Emergency Medicine | Admitting: Emergency Medicine

## 2017-10-03 ENCOUNTER — Other Ambulatory Visit: Payer: Self-pay

## 2017-10-03 ENCOUNTER — Encounter (HOSPITAL_COMMUNITY): Payer: Self-pay | Admitting: Emergency Medicine

## 2017-10-03 DIAGNOSIS — R079 Chest pain, unspecified: Secondary | ICD-10-CM | POA: Diagnosis not present

## 2017-10-03 DIAGNOSIS — Z79899 Other long term (current) drug therapy: Secondary | ICD-10-CM | POA: Diagnosis not present

## 2017-10-03 DIAGNOSIS — R1084 Generalized abdominal pain: Secondary | ICD-10-CM | POA: Diagnosis not present

## 2017-10-03 LAB — POCT I-STAT TROPONIN I: Troponin i, poc: 0.01 ng/mL (ref 0.00–0.08)

## 2017-10-03 LAB — CBC
HCT: 48 % — ABNORMAL HIGH (ref 36.0–46.0)
HEMOGLOBIN: 16.6 g/dL — AB (ref 12.0–15.0)
MCH: 33.5 pg (ref 26.0–34.0)
MCHC: 34.6 g/dL (ref 30.0–36.0)
MCV: 96.8 fL (ref 78.0–100.0)
Platelets: 320 10*3/uL (ref 150–400)
RBC: 4.96 MIL/uL (ref 3.87–5.11)
RDW: 13.5 % (ref 11.5–15.5)
WBC: 16 10*3/uL — ABNORMAL HIGH (ref 4.0–10.5)

## 2017-10-03 LAB — BASIC METABOLIC PANEL
ANION GAP: 11 (ref 5–15)
BUN: 14 mg/dL (ref 6–20)
CALCIUM: 9 mg/dL (ref 8.9–10.3)
CO2: 25 mmol/L (ref 22–32)
CREATININE: 0.72 mg/dL (ref 0.44–1.00)
Chloride: 102 mmol/L (ref 98–111)
GFR calc Af Amer: >60 mL/min (ref >60)
GLUCOSE: 115 mg/dL — AB (ref 70–99)
POTASSIUM: 4.3 mmol/L (ref 3.5–5.1)
Sodium: 138 mmol/L (ref 135–145)

## 2017-10-03 MED ORDER — GI COCKTAIL ~~LOC~~
30.0000 mL | Freq: Once | ORAL | Status: AC
  Administered 2017-10-03: 30 mL via ORAL
  Filled 2017-10-03: qty 30

## 2017-10-03 NOTE — Discharge Instructions (Addendum)
Your lab work today looked pretty good. Your EKG and cardiac marker was also normal which helps us in evaluating heart attack.   There is no clear explanation of your symptoms today. Most likely it is due to acid reflux. You may use over-the-counter products like Tums, Zantac, Prilosec, etc if it continues to bother you. I also recommend that you follow-up with your PCP if you have a similar episode like this.

## 2017-10-03 NOTE — ED Triage Notes (Signed)
Pt c/o intermittent chest pain and abdominal pain since 5am this morning. Pt describes pain as dull that has been over the mid and L chest, mid and upper R abdominal pain and R back pain, some nausea, denies V/D. Pt states she took an antacid for pain this morning, no relief.

## 2017-10-03 NOTE — ED Provider Notes (Signed)
[facility]-EMERGENCY DEPT Provider Note  CSN: [id number] Arrival date & time: 10/03/17  1350  History   Chief Complaint Chief Complaint  Patient presents with  . Chest Pain  . Abdominal Pain    HPI April Cook is a 57 y.o. female with no significant medical history who presented to the ED for abdominal pain x3 hours. She describes dull and burning 5/10 pain that began in her back bilaterally then moved to the right side of her abdomen and to the left side of her chest. Patient currently has epigastric pain. She denies having episodes like this before. Denies chest pain, SOB, diaphoresis, palpitations, leg swelling, N/V or urinary symptoms. Patient states she took an OTC antacid with minimal relief. mobile abdominal pain that began   Past Medical History:  Diagnosis Date  . Depression   . Gestational diabetes   . 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 Microscopic hematuria    h/o-negative UA    Patient Active Problem List   Diagnosis Date Noted  . Depression, recurrent (HCC) 06/23/2016  . Insomnia 12/02/2013    Past Surgical History:  Procedure Laterality Date  . NOVASURE ABLATION  05/25/06  . resection of polyp  1997   cervical  . WISDOM TOOTH EXTRACTION       OB History    Gravida  2   Para  2   Term      Preterm      AB      Living  2     SAB      TAB      Ectopic      Multiple      Live Births               Home Medications    Prior to Admission medications   Medication Sig Start Date End Date Taking? Authorizing Provider  ibuprofen (ADVIL,MOTRIN) 200 MG tablet Take 800 mg by mouth every 6 (six) hours as needed for moderate pain.    Yes [provider]  LORazepam (ATIVAN) 0.5 MG tablet Take 1-2 tablets by mouth daily as needed for anxiety.  05/17/16  Yes [provider]  ranitidine (ZANTAC) 150 MG tablet Take 300 mg by mouth 2 (two) times daily as needed for heartburn.   Yes [provider]  sertraline  (ZOLOFT) 100 MG tablet Take 100 mg by mouth daily.  09/08/12  Yes [provider]  traZODone (DESYREL) 50 MG tablet Take 50 mg by mouth at bedtime.  08/18/12  Yes [provider]    Family History Family History  Problem Relation Age of Onset  . Hypertension Father   . Heart attack Father   . Alzheimer's disease Father   . Diabetes Maternal Grandmother   . Hypertension Mother   . Rectal cancer Mother 34  . Hypertension Brother     Social History Social History   Tobacco Use  . Smoking status: Never Smoker  . Smokeless tobacco: Never Used  . Tobacco comment: lives with a smoker  Substance Use Topics  . Alcohol use: Yes    Alcohol/week: 14.0 standard drinks    Types: 14 Standard drinks or equivalent per week  . Drug use: No     Allergies   Patient has no known allergies.   Review of Systems Review of Systems  Constitutional: Negative.   Respiratory: Negative for chest tightness and shortness of breath.   Cardiovascular: Positive for chest pain. Negative for palpitations and leg swelling.  Gastrointestinal: Positive for abdominal pain. Negative for constipation, diarrhea, nausea and vomiting.  Musculoskeletal: Positive for back pain. Negative for gait problem, joint swelling, neck pain and neck stiffness.  Skin: Negative.   Neurological: Negative.      Physical Exam Updated Vital Signs BP 129/70 (BP Location: Left Arm)   Pulse 74   Temp 98 F (36.7 C) (Oral)   Resp 16   Ht 5' 2.5" (1.588 m)   Wt 72.6 kg   SpO2 100%   BMI 28.80 kg/m   Physical Exam  Constitutional: Vital signs are normal. She appears well-developed and well-nourished.  Neck: Normal range of motion. Neck supple.  Cardiovascular: Normal rate and regular rhythm.  Pulses:      Carotid pulses are 2+ on the right side, and 2+ on the left side.      Radial pulses are 2+ on the right side, and 2+ on the left side.       Dorsalis pedis pulses are 2+ on the right side, and 2+ on the  left side.       Posterior tibial pulses are 2+ on the right side, and 2+ on the left side.  Pulmonary/Chest: Effort normal and breath sounds normal. No respiratory distress.  Abdominal: Soft. Bowel sounds are normal. There is no tenderness.  Musculoskeletal: Normal range of motion.       Right lower leg: Normal. She exhibits no edema.       Left lower leg: Normal. She exhibits no edema.  Neurological: She is alert.  Skin: Skin is warm. Capillary refill takes less than 2 seconds. She is not diaphoretic. No pallor.  Psychiatric: She has a normal mood and affect. Her behavior is normal.  Nursing note and vitals reviewed.  ED Treatments / Results  Labs (all labs ordered are listed, but only abnormal results are displayed) Labs Reviewed  BASIC METABOLIC PANEL - Abnormal; Notable for the following components:      Result Value   Glucose, Bld 115 (*)    All other components within normal limits  CBC - Abnormal; Notable for the following components:   WBC 16.0 (*)    Hemoglobin 16.6 (*)    HCT 48.0 (*)    All other components within normal limits  I-STAT TROPONIN, ED  POCT I-STAT TROPONIN I    EKG EKG Interpretation  Date/Time:  Saturday October 03 2017 14:07:17 EDT Ventricular Rate:  74 PR Interval:    QRS Duration: 103 QT Interval:  395 QTC Calculation: 439 R Axis:   33 Text Interpretation:  Sinus rhythm RSR' in V1 or V2, probably normal variant Baseline wander in lead(s) V6 Confirmed by Kristine Royal [phone]) on 10/03/2017 3:20:07 PM   Radiology Dg Chest 2 View  Result Date: 10/03/2017 CLINICAL DATA:  Intermittent chest pain since 5 a.m. this morning. EXAM: CHEST - 2 VIEW COMPARISON:  Single-view of the chest and left ribs 02/08/2017. FINDINGS: The lungs are clear. Heart size is normal. No pneumothorax or pleural effusion. No acute or focal bony abnormality. IMPRESSION: No acute disease. Electronically Signed   By: Drusilla Kanner M.D.   On: 10/03/2017 14:37     Procedures Procedures (including critical care time)  Medications Ordered in ED Medications  gi cocktail (Maalox,Lidocaine,Donnatal) (30 mLs Oral Given 10/03/17 1628)     Initial Impression / Assessment and Plan / ED Course  Triage vital signs and the nursing notes have been reviewed.  Pertinent labs & imaging results that were available during care of  the patient were reviewed and considered in medical decision making (see chart for details).  Patient presents to the ED with normal vital signs and is well appearing. She presents with a peculiar history of pain, mostly abdominal in nature, that has been mobile and lasted for 3 hours today. Physical exam is unremarkable which is reassuring. Prelim labs drawn are also normal and do not point to acute infectious or metabolic cause to symptoms. EKG is NSR without any acute changes to suggest ischemia or infarct which is also reassuring. Unclear etiology to complaints at this time. Possibly MSK vs GERD related.   Final Clinical Impressions(s) / ED Diagnoses  1. Generalized Abdominal Pain. Education provided on s/s that warrant follow-up to PCP vs ED.  Dispo: Home. After thorough clinical evaluation, this patient is determined to be medically stable and can be safely discharged with the previously mentioned treatment and/or outpatient follow-up/referral(s). At this time, there are no other apparent medical conditions that require further screening, evaluation or treatment.   Final diagnoses:  Generalized abdominal pain    ED Discharge Orders    None        Reva BoresMortis, Makaia Rappa I, PA-C 10/03/17 1712    Wynetta FinesMessick, Peter C, MD 10/05/17 [phone]

## 2017-10-03 NOTE — ED Notes (Signed)
EDPA Provider at bedside. 

## 2017-11-06 ENCOUNTER — Other Ambulatory Visit: Payer: Self-pay

## 2017-11-06 ENCOUNTER — Encounter: Payer: Self-pay | Admitting: Family Medicine

## 2017-11-06 ENCOUNTER — Emergency Department (HOSPITAL_COMMUNITY): Payer: BLUE CROSS/BLUE SHIELD

## 2017-11-06 ENCOUNTER — Inpatient Hospital Stay (HOSPITAL_COMMUNITY)
Admission: EM | Admit: 2017-11-06 | Discharge: 2017-11-09 | DRG: 419 | Disposition: A | Discharge status: Home / Self Care | Payer: BLUE CROSS/BLUE SHIELD | Attending: General Surgery | Admitting: General Surgery

## 2017-11-06 ENCOUNTER — Encounter (HOSPITAL_COMMUNITY): Payer: Self-pay | Admitting: Emergency Medicine

## 2017-11-06 ENCOUNTER — Ambulatory Visit: Payer: BLUE CROSS/BLUE SHIELD | Admitting: Family Medicine

## 2017-11-06 VITALS — BP 109/72 | HR 88 | Temp 98.2°F | Ht 62.5 in | Wt 152.0 lb

## 2017-11-06 DIAGNOSIS — G4700 Insomnia, unspecified: Secondary | ICD-10-CM | POA: Diagnosis not present

## 2017-11-06 DIAGNOSIS — R1011 Right upper quadrant pain: Secondary | ICD-10-CM | POA: Diagnosis not present

## 2017-11-06 DIAGNOSIS — K810 Acute cholecystitis: Secondary | ICD-10-CM | POA: Diagnosis not present

## 2017-11-06 DIAGNOSIS — K219 Gastro-esophageal reflux disease without esophagitis: Secondary | ICD-10-CM | POA: Diagnosis not present

## 2017-11-06 DIAGNOSIS — F329 Major depressive disorder, single episode, unspecified: Secondary | ICD-10-CM | POA: Diagnosis present

## 2017-11-06 DIAGNOSIS — K8000 Calculus of gallbladder with acute cholecystitis without obstruction: Principal | ICD-10-CM | POA: Diagnosis present

## 2017-11-06 DIAGNOSIS — R198 Other specified symptoms and signs involving the digestive system and abdomen: Secondary | ICD-10-CM | POA: Diagnosis not present

## 2017-11-06 DIAGNOSIS — F17200 Nicotine dependence, unspecified, uncomplicated: Secondary | ICD-10-CM | POA: Diagnosis not present

## 2017-11-06 DIAGNOSIS — K8020 Calculus of gallbladder without cholecystitis without obstruction: Secondary | ICD-10-CM

## 2017-11-06 DIAGNOSIS — F419 Anxiety disorder, unspecified: Secondary | ICD-10-CM | POA: Diagnosis not present

## 2017-11-06 HISTORY — DX: Gastro-esophageal reflux disease without esophagitis: K21.9

## 2017-11-06 HISTORY — DX: Anxiety disorder, unspecified: F41.9

## 2017-11-06 HISTORY — DX: Pneumonia, unspecified organism: J18.9

## 2017-11-06 HISTORY — DX: Anemia, unspecified: D64.9

## 2017-11-06 LAB — COMPREHENSIVE METABOLIC PANEL
ALBUMIN: 4.1 g/dL (ref 3.5–5.0)
ALT: 42 U/L (ref 0–44)
AST: 37 U/L (ref 15–41)
Alkaline Phosphatase: 129 U/L — ABNORMAL HIGH (ref 38–126)
Anion gap: 13 (ref 5–15)
BILIRUBIN TOTAL: 1 mg/dL (ref 0.3–1.2)
BUN: 8 mg/dL (ref 6–20)
CALCIUM: 9.1 mg/dL (ref 8.9–10.3)
CO2: 26 mmol/L (ref 22–32)
Chloride: 100 mmol/L (ref 98–111)
Creatinine, Ser: 0.7 mg/dL (ref 0.44–1.00)
GFR calc Af Amer: >60 mL/min (ref >60)
GFR calc non Af Amer: >60 mL/min (ref >60)
GLUCOSE: 108 mg/dL — AB (ref 70–99)
Potassium: 3.6 mmol/L (ref 3.5–5.1)
Sodium: 139 mmol/L (ref 135–145)
TOTAL PROTEIN: 7.3 g/dL (ref 6.5–8.1)

## 2017-11-06 LAB — URINALYSIS, ROUTINE W REFLEX MICROSCOPIC
BACTERIA UA: NONE SEEN
Bilirubin Urine: NEGATIVE
GLUCOSE, UA: NEGATIVE mg/dL
Ketones, ur: 80 mg/dL — AB
Leukocytes, UA: NEGATIVE
NITRITE: NEGATIVE
Protein, ur: NEGATIVE mg/dL
SPECIFIC GRAVITY, URINE: 1.013 (ref 1.005–1.030)
pH: 6 (ref 5.0–8.0)

## 2017-11-06 LAB — CBC
HCT: 50 % — ABNORMAL HIGH (ref 36.0–46.0)
Hemoglobin: 16.8 g/dL — ABNORMAL HIGH (ref 12.0–15.0)
MCH: 32.6 pg (ref 26.0–34.0)
MCHC: 33.6 g/dL (ref 30.0–36.0)
MCV: 96.9 fL (ref 78.0–100.0)
Platelets: 265 10*3/uL (ref 150–400)
RBC: 5.16 MIL/uL — ABNORMAL HIGH (ref 3.87–5.11)
RDW: 13.7 % (ref 11.5–15.5)
WBC: 17 10*3/uL — ABNORMAL HIGH (ref 4.0–10.5)

## 2017-11-06 LAB — TYPE AND SCREEN
ABO/RH(D): A NEG
Antibody Screen: NEGATIVE

## 2017-11-06 LAB — LIPASE, BLOOD: Lipase: 27 U/L (ref 11–51)

## 2017-11-06 MED ORDER — ONDANSETRON 4 MG PO TBDP: 4.0000 mg | ORAL_TABLET | Freq: Four times a day (QID) | ORAL | Status: DC | PRN

## 2017-11-06 MED ORDER — SODIUM CHLORIDE 0.9 % IV SOLN
2.0000 g | Freq: Once | INTRAVENOUS | Status: AC
  Administered 2017-11-06: 2 g via INTRAVENOUS
  Filled 2017-11-06: qty 20

## 2017-11-06 MED ORDER — MORPHINE SULFATE (PF) 2 MG/ML IV SOLN
2.0000 mg | INTRAVENOUS | Status: DC | PRN
  Administered 2017-11-06 – 2017-11-07 (×5): 2 mg via INTRAVENOUS
  Filled 2017-11-06 (×5): qty 1

## 2017-11-06 MED ORDER — HYDROMORPHONE HCL 1 MG/ML IJ SOLN
0.5000 mg | Freq: Once | INTRAMUSCULAR | Status: AC
  Administered 2017-11-06: 0.5 mg via INTRAVENOUS
  Filled 2017-11-06: qty 1

## 2017-11-06 MED ORDER — METRONIDAZOLE IN NACL 5-0.79 MG/ML-% IV SOLN
500.0000 mg | Freq: Once | INTRAVENOUS | Status: AC
  Administered 2017-11-06: 500 mg via INTRAVENOUS
  Filled 2017-11-06: qty 100

## 2017-11-06 MED ORDER — FAMOTIDINE IN NACL 20-0.9 MG/50ML-% IV SOLN
20.0000 mg | Freq: Two times a day (BID) | INTRAVENOUS | Status: DC
  Administered 2017-11-06: 20 mg via INTRAVENOUS
  Filled 2017-11-06: qty 50

## 2017-11-06 MED ORDER — SODIUM CHLORIDE 0.9 % IV BOLUS
1000.0000 mL | Freq: Once | INTRAVENOUS | Status: AC
  Administered 2017-11-06: 1000 mL via INTRAVENOUS

## 2017-11-06 MED ORDER — SERTRALINE HCL 100 MG PO TABS: 100.0000 mg | ORAL_TABLET | Freq: Every day | ORAL | Status: DC

## 2017-11-06 MED ORDER — PIPERACILLIN-TAZOBACTAM 3.375 G IVPB
3.3750 g | Freq: Three times a day (TID) | INTRAVENOUS | Status: DC
  Administered 2017-11-06 – 2017-11-07 (×2): 3.375 g via INTRAVENOUS
  Filled 2017-11-06 (×4): qty 50

## 2017-11-06 MED ORDER — POTASSIUM CHLORIDE IN NACL 20-0.9 MEQ/L-% IV SOLN
INTRAVENOUS | Status: DC
  Administered 2017-11-06: 20:00:00 via INTRAVENOUS
  Filled 2017-11-06 (×2): qty 1000

## 2017-11-06 MED ORDER — PANTOPRAZOLE SODIUM 40 MG PO TBEC: 40.0000 mg | DELAYED_RELEASE_TABLET | Freq: Every day | ORAL | 3 refills | Status: DC

## 2017-11-06 MED ORDER — HEPARIN SODIUM (PORCINE) 5000 UNIT/ML IJ SOLN
5000.0000 [IU] | Freq: Once | INTRAMUSCULAR | Status: AC
  Administered 2017-11-06: 5000 [IU] via SUBCUTANEOUS
  Filled 2017-11-06: qty 1

## 2017-11-06 MED ORDER — TRAZODONE HCL 50 MG PO TABS
50.0000 mg | ORAL_TABLET | Freq: Every day | ORAL | Status: DC
  Administered 2017-11-06: 50 mg via ORAL
  Filled 2017-11-06: qty 1

## 2017-11-06 MED ORDER — ONDANSETRON HCL 4 MG/2ML IJ SOLN: 4.0000 mg | Freq: Four times a day (QID) | INTRAMUSCULAR | Status: DC | PRN

## 2017-11-06 NOTE — Plan of Care (Signed)
Pt stable with no needs. No changes to note. Rn medicating pt for pain. Pt knows and understands plan of care. No changes to current care plans needs.

## 2017-11-06 NOTE — ED Triage Notes (Signed)
Pt complaint of right abdominal pain with n/v/d; PCP concerned for gallbladder infection.

## 2017-11-06 NOTE — Progress Notes (Signed)
Subjective: CC: abdominal pain PCP: Remus Loffler, PA-C [id number] April Cook is a 57 y.o. female presenting to clinic today for:  1. Abdominal pain Patient here for evaluation of severe right upper quadrant abdominal pain that is been ongoing for several days.  She notes that this actually started on the left side and seemed to move to the right side.  She reports having associated nonbloody, nonbilious vomit.  She has not been eating much over the last couple of days but has been hydrating.  She reports T-max of 100.2 F yesterday.  Symptoms seem to start after eating popcorn.  She had a similar episode a couple of months ago, which she sought care in the emergency department for.  It was thought that she was having gastritis and she was discharged on ranitidine.  She reports use of the medication but states that the symptoms persist.  She regularly takes ibuprofen.  She had mild nonbloody diarrhea today but had normal stools prior to today.  No constipation.  Denies any consumption of undercooked foods, foods left out too long or untreated water.  No new pets.  She has her gallbladder and appendix in place.  She sees Dr. Elnoria Howard, gastroenterology, for colonoscopy.  Last scope 10/21/2011 and noted nonbleeding AVM, hemorrhoids and sigmoid diverticulum.  Otherwise, unremarkable.   ROS: Per HPI  No Known Allergies Past Medical History:  Diagnosis Date  . Depression   . Gestational diabetes   . 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 Microscopic hematuria    h/o-negative UA    Current Outpatient Medications:  .  ibuprofen (ADVIL,MOTRIN) 200 MG tablet, Take 800 mg by mouth every 6 (six) hours as needed for moderate pain. , Disp: , Rfl:  .  LORazepam (ATIVAN) 0.5 MG tablet, Take 1-2 tablets by mouth daily as needed for anxiety. , Disp: , Rfl: 1 .  ranitidine (ZANTAC) 150 MG tablet, Take 300 mg by mouth 2 (two) times daily as needed for heartburn., Disp: , Rfl:  .  sertraline (ZOLOFT) 100 MG tablet, Take 100 mg by  mouth daily. , Disp: , Rfl:  .  traZODone (DESYREL) 50 MG tablet, Take 50 mg by mouth at bedtime. , Disp: , Rfl:  Social History   Socioeconomic History  . Marital status: Divorced    Spouse name: Not on file  . Number of children: Not on file  . Years of education: Not on file  . Highest education level: Not on file  Occupational History  . Not on file  Social Needs  . Financial resource strain: Not on file  . Food insecurity:    Worry: Not on file    Inability: Not on file  . Transportation needs:    Medical: Not on file    Non-medical: Not on file  Tobacco Use  . Smoking status: Never Smoker  . Smokeless tobacco: Never Used  . Tobacco comment: lives with a smoker  Substance and Sexual Activity  . Alcohol use: Yes    Alcohol/week: 14.0 standard drinks    Types: 14 Standard drinks or equivalent per week  . Drug use: No  . Sexual activity: Yes    Partners: Male  Lifestyle  . Physical activity:    Days per week: Not on file    Minutes per session: Not on file  . Stress: Not on file  Relationships  . Social connections:    Talks on phone: Not on file    Gets together: Not on file  Attends religious service: Not on file    Active member of club or organization: Not on file    Attends meetings of clubs or organizations: Not on file    Relationship status: Not on file  . Intimate partner violence:    Fear of current or ex partner: Not on file    Emotionally abused: Not on file    Physically abused: Not on file    Forced sexual activity: Not on file  Other Topics Concern  . Not on file  Social History Narrative  . Not on file   Family History  Problem Relation Age of Onset  . Hypertension Father   . Heart attack Father   . Alzheimer's disease Father   . Diabetes Maternal Grandmother   . Hypertension Mother   . Rectal cancer Mother 87  . Hypertension Brother     Objective: Office vital signs reviewed. BP 109/72   Pulse 88   Temp 98.2 F (36.8 C) (Oral)    Ht 5' 2.5" (1.588 m)   Wt 152 lb (68.9 kg)   BMI 27.36 kg/m   Physical Examination:  General: Awake, alert, well nourished, nontoxic. No acute distress HEENT: Normal    Eyes: PERRLA, extraocular membranes intact, sclera white    Throat: moist mucus membranesent Cardio: regular rate  Pulm: normal work of breathing on room air GI: flat, substantial TTP to the RUQ and epigastric area. +Murphy's. non-distended, bowel sounds present x4, no hepatomegaly, no splenomegaly  Assessment/ Plan: 57 y.o. female   1. RUQ abdominal pain Patient with substantial tenderness to palpation to the right upper quadrant/positive Murphy sign.  Vital signs are stable.  She is afebrile and nontoxic-appearing.  No evidence of dehydration.  I am concerned for acute cholecystitis given substantial tenderness to palpation and low-grade fevers at home.  I recommended emergent evaluation the emergency department.  Will need stat imaging and labs.  Triage nurse at [facility] emergency department contacted.  DDx considered include acute appendicitis, right sided diverticulitis, hepatitis, duodenal ulcer.  Additionally, I discontinue patient's ranitidine and replaced it with Protonix.  She will likely need follow-up at some point with her gastroenterologist, Dr. Elnoria Howard.  2. Positive Murphy's Sign   Meds ordered this encounter  Medications  . pantoprazole (PROTONIX) 40 MG tablet    Sig: Take 1 tablet (40 mg total) by mouth daily.    Dispense:  30 tablet    Refill:  3     Sasan Wilkie Hulen Skains, DO [facility] [phone]

## 2017-11-06 NOTE — H&P (Signed)
April Cook is an 57 y.o. female.   Chief Complaint: Abdominal pain HPI: Pleasant generally healthy 57 year old female 2 days ago developed the onset of right upper quadrant abdominal pain.  Describes pain beneath the right costal margin radiating somewhat laterally.  The pain became severe and she had an episode of vomiting.  She tried to tough it out but last night had some low-grade fever and the pain persisted and she saw her primary physician today.  There was concern about cholecystitis and she was sent to the emergency room for further evaluation and imaging.  The pain is worse with any motion.  Continues to have some nausea.  No jaundice noted.  No history of any similar symptoms.  No previous abdominal surgery.  Past Medical History:  Diagnosis Date  . Depression   . Gestational diabetes   . 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 Microscopic hematuria    h/o-negative UA    Past Surgical History:  Procedure Laterality Date  . NOVASURE ABLATION  05/25/06  . resection of polyp  1997   cervical  . WISDOM TOOTH EXTRACTION      Family History  Problem Relation Age of Onset  . Hypertension Father   . Heart attack Father   . Alzheimer's disease Father   . Diabetes Maternal Grandmother   . Hypertension Mother   . Rectal cancer Mother 60  . Hypertension Brother    Social History:  reports that she has never smoked. She has never used smokeless tobacco. She reports that she drinks about 14.0 standard drinks of alcohol per week. She reports that she does not use drugs.  Allergies: No Known Allergies  Current Facility-Administered Medications  Medication Dose Route Frequency Provider Last Rate Last Dose  . cefTRIAXone (ROCEPHIN) 2 g in sodium chloride 0.9 % 100 mL IVPB  2 g Intravenous Once Maudie Flakes, MD 200 mL/hr at 11/06/17 1651 2 g at 11/06/17 1651  . metroNIDAZOLE (FLAGYL) IVPB 500 mg  500 mg Intravenous Once Maudie Flakes, MD      . sodium chloride 0.9 % bolus 1,000 mL  1,000 mL  Intravenous Once Maudie Flakes, MD 983.6 mL/hr at 11/06/17 1648 1,000 mL at 11/06/17 1648   Current Outpatient Medications  Medication Sig Dispense Refill  . bismuth subsalicylate (PEPTO-BISMOL) 262 MG/15ML suspension Take 30 mLs by mouth every 6 (six) hours as needed for indigestion.    Marland Kitchen ibuprofen (ADVIL,MOTRIN) 200 MG tablet Take 800 mg by mouth every 6 (six) hours as needed for moderate pain.     Marland Kitchen LORazepam (ATIVAN) 0.5 MG tablet Take 1-2 tablets by mouth daily as needed for anxiety.   1  . ranitidine (ZANTAC) 150 MG tablet Take 75-150 mg by mouth 2 (two) times daily as needed for heartburn.    . sertraline (ZOLOFT) 100 MG tablet Take 100 mg by mouth daily.     . traZODone (DESYREL) 50 MG tablet Take 50 mg by mouth at bedtime.     . pantoprazole (PROTONIX) 40 MG tablet Take 1 tablet (40 mg total) by mouth daily. 30 tablet 3     Results for orders placed or performed during the hospital encounter of 11/06/17 (from the past 48 hour(s))  Lipase, blood     Status: None   Collection Time: 11/06/17  1:00 PM  Result Value Ref Range   Lipase 27 11 - 51 U/L    Comment: Performed at [facility], [place] [address], [place], [place] [address]  Comprehensive metabolic panel     Status: Abnormal   Collection Time: 11/06/17  1:00 PM  Result Value Ref Range   Sodium 139 135 - 145 mmol/L   Potassium 3.6 3.5 - 5.1 mmol/L   Chloride 100 98 - 111 mmol/L   CO2 26 22 - 32 mmol/L   Glucose, Bld 108 (H) 70 - 99 mg/dL   BUN 8 6 - 20 mg/dL   Creatinine, Ser 0.70 0.44 - 1.00 mg/dL   Calcium 9.1 8.9 - 10.3 mg/dL   Total Protein 7.3 6.5 - 8.1 g/dL   Albumin 4.1 3.5 - 5.0 g/dL   AST 37 15 - 41 U/L   ALT 42 0 - 44 U/L   Alkaline Phosphatase 129 (H) 38 - 126 U/L   Total Bilirubin 1.0 0.3 - 1.2 mg/dL   GFR calc non Af Amer >60 >60 mL/min   GFR calc Af Amer >60 >60 mL/min    Comment: (NOTE) The eGFR has been calculated using the CKD EPI equation. This calculation has not been validated  in all clinical situations. eGFR's persistently <60 mL/min signify possible Chronic Kidney Disease.    Anion gap 13 5 - 15    Comment: Performed at [facility], [place] [address] [place], [place] [address]  CBC     Status: Abnormal   Collection Time: 11/06/17  1:00 PM  Result Value Ref Range   WBC 17.0 (H) 4.0 - 10.5 K/uL   RBC 5.16 (H) 3.87 - 5.11 MIL/uL   Hemoglobin 16.8 (H) 12.0 - 15.0 g/dL   HCT 50.0 (H) 36.0 - 46.0 %   MCV 96.9 78.0 - 100.0 fL   MCH 32.6 26.0 - 34.0 pg   MCHC 33.6 30.0 - 36.0 g/dL   RDW 13.7 11.5 - 15.5 %   Platelets 265 150 - 400 K/uL    Comment: Performed at [facility], [place] [address] [place], [place] [address]  Urinalysis, Routine w reflex microscopic     Status: Abnormal   Collection Time: 11/06/17  1:37 PM  Result Value Ref Range   Color, Urine YELLOW YELLOW   APPearance CLEAR CLEAR   Specific Gravity, Urine 1.013 1.005 - 1.030   pH 6.0 5.0 - 8.0   Glucose, UA NEGATIVE NEGATIVE mg/dL   Hgb urine dipstick MODERATE (A) NEGATIVE   Bilirubin Urine NEGATIVE NEGATIVE   Ketones, ur 80 (A) NEGATIVE mg/dL   Protein, ur NEGATIVE NEGATIVE mg/dL   Nitrite NEGATIVE NEGATIVE   Leukocytes, UA NEGATIVE NEGATIVE   RBC / HPF 11-20 0 - 5 RBC/hpf   WBC, UA 0-5 0 - 5 WBC/hpf   Bacteria, UA NONE SEEN NONE SEEN   Squamous Epithelial / LPF 0-5 0 - 5   Mucus PRESENT     Comment: Performed at [facility], [place] [address], [place], [place] [address]   US Abdomen Limited Ruq  Result Date: 11/06/2017 CLINICAL DATA:  Right upper quadrant pain for 2 days EXAM: ULTRASOUND ABDOMEN LIMITED RIGHT UPPER QUADRANT COMPARISON:  None. FINDINGS: Gallbladder: Layering and shadowing gallstones, 1 fixed in the neck. There is also dependent debris/sludge. Gallbladder is full and the wall measures up to 4 mm in thickness. Some striations and pericholecystic edema is present. Sonographic Murphy sign per technologist Common bile duct:  Diameter: 5 mm.  Where visualized, no filling defect. Liver: No focal lesion identified. Within normal limits in parenchymal echogenicity. Portal vein is patent on color Doppler imaging with normal direction of  blood flow towards the liver. IMPRESSION: Findings of acute calculus cholecystitis. Electronically Signed   By: Monte Fantasia M.D.   On: 11/06/2017 15:58    Review of Systems  Constitutional: Positive for fever. Negative for chills.  Respiratory: Negative.   Cardiovascular: Negative.   Gastrointestinal: Positive for abdominal pain, constipation, nausea and vomiting. Negative for diarrhea.  Genitourinary: Negative.     Blood pressure 135/75, pulse 86, temperature 98.7 F (37.1 C), temperature source Oral, resp. rate 15, SpO2 99 %. Physical Exam  General: Alert, well-developed Caucasian female, in no distress Skin: Warm and dry without rash or infection. HEENT: No palpable masses or thyromegaly. Sclera nonicteric. Pupils equal round and reactive.  Lymph nodes: No cervical, supraclavicular, or inguinal nodes palpable. Lungs: Breath sounds clear and equal without increased work of breathing Cardiovascular: Regular rate and rhythm without murmur. No JVD or edema. Peripheral pulses intact. Abdomen: Nondistended.  There is localized fairly marked tenderness in the right upper quadrant with guarding.  No masses palpable. No organomegaly. No palpable hernias. Extremities: No edema or joint swelling or deformity. No chronic venous stasis changes. Neurologic: Alert and fully oriented.  Affect normal.  No gross motor deficits.   Assessment/Plan Lithiasis with acute cholecystitis.  The patient will be admitted for IV antibiotics, symptom control and IV fluids.  Plan urgent laparoscopic cholecystectomy.  I discussed the plan with the patient including the nature of surgery and indications as well as risks of anesthetic complications, bleeding, infection, visceral injury or need for open  procedure.  She understands and agrees.  Edward Jolly, MD 11/06/2017, 5:06 PM

## 2017-11-06 NOTE — Patient Instructions (Signed)
I am very concerned that you may have an infected gallbladder.  Your physical exam is suggestive of this.  I recommend immediate evaluation by the emergency department.   Cholecystitis Cholecystitis is inflammation of the gallbladder. It is often called a gallbladder attack. The gallbladder is a pear-shaped organ that lies beneath the liver on the right side of the body. The gallbladder stores bile, which is a fluid that helps the body to digest fats. If bile builds up in your gallbladder, your gallbladder becomes inflamed. This condition may occur suddenly (be acute). Repeat episodes of acute cholecystitis or prolonged episodes may lead to a long-term (chronic) condition. Cholecystitis is serious and it requires treatment. What are the causes? The most common cause of this condition is gallstones. Gallstones can block the tube (duct) that carries bile out of your gallbladder. This causes bile to build up. Other causes of this condition include:  Damage to the gallbladder due to a decrease in blood flow.  Infections in the bile ducts.  Scars or kinks in the bile ducts.  Tumors in the liver, pancreas, or gallbladder.  What increases the risk? This condition is more likely to develop in:  People who have sickle cell disease.  People who take birth control pills or use estrogen.  People who have alcoholic liver disease.  People who have liver cirrhosis.  People who have their nutrition delivered through a vein (parenteral nutrition).  People who do not eat or drink (do fasting) for a long period of time.  People who are obese.  People who have rapid weight loss.  People who are pregnant.  People who have increased triglyceride levels.  People who have pancreatitis.  What are the signs or symptoms? Symptoms of this condition include:  Abdominal pain, especially in the upper right area of the abdomen.  Abdominal tenderness or  bloating.  Nausea.  Vomiting.  Fever.  Chills.  Yellowing of the skin and the whites of the eyes (jaundice).  How is this diagnosed? This condition is diagnosed with a medical history and physical exam. You may also have other tests, including:  Imaging tests, such as: ? An ultrasound of the gallbladder. ? A CT scan of the abdomen. ? A gallbladder nuclear scan (HIDA scan). This scan allows your health care provider to see the bile moving from your liver to your gallbladder and to your small intestine. ? MRI.  Blood tests, such as: ? A complete blood count, because the white blood cell count may be higher than normal. ? Liver function tests, because some levels may be higher than normal with certain types of gallstones.  How is this treated? Treatment may include:  Fasting for a certain amount of time.  IV fluids.  Medicine to treat pain or vomiting.  Antibiotic medicine.  Surgery to remove your gallbladder (cholecystectomy). This may happen immediately or at a later time.  Follow these instructions at home: Home care will depend on your treatment. In general:  Take over-the-counter and prescription medicines only as told by your health care provider.  If you were prescribed an antibiotic medicine, take it as told by your health care provider. Do not stop taking the antibiotic even if you start to feel better.  Follow instructions from your health care provider about what to eat or drink. When you are allowed to eat, avoid eating or drinking anything that triggers your symptoms.  Keep all follow-up visits as told by your health care provider. This is important.  Contact  a health care provider if:  Your pain is not controlled with medicine.  You have a fever. Get help right away if:  Your pain moves to another part of your abdomen or to your back.  You continue to have symptoms or you develop new symptoms even with treatment. This information is not intended  to replace advice given to you by your health care provider. Make sure you discuss any questions you have with your health care provider. Document Released: 01/20/2005 Document Revised: 05/31/2015 Document Reviewed: 05/03/2014 Elsevier Interactive Patient Education  2018 [employer].

## 2017-11-06 NOTE — ED Provider Notes (Signed)
[facility] Emergency Department Provider Note MRN:  [id number]  Arrival date & time: 11/06/17     Chief Complaint   Abdominal Pain   History of Present Illness   April Cook is a 57 y.o. year-old female with a history of depression presenting to the ED with chief complaint of abdominal pain.  The pain is located in the right upper quadrant.  Patient explains that the pain began 2 to 3 days ago, was initially in the left upper quadrant.  Associated with subjective fevers at home, few episodes of nonbloody nonbilious emesis.  Pain is constant, worse with palpation, no alleviating factors.  Seen by PCP this morning, sent here for evaluation.  Review of Systems  A complete 10 system review of systems was obtained and all systems are negative except as noted in the HPI and PMH.   Patient's Health History    Past Medical History:  Diagnosis Date  . Anemia   . Anxiety   . Depression   . GERD (gastroesophageal reflux disease)   . Gestational diabetes   . 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 Microscopic hematuria    h/o-negative UA  . Pneumonia     Past Surgical History:  Procedure Laterality Date  . ABLATION  2008  . CERVICAL BIOPSY  1996   for polyp was benign  . NOVASURE ABLATION  05/25/06  . resection of polyp  1997   cervical  . WISDOM TOOTH EXTRACTION      Family History  Problem Relation Age of Onset  . Hypertension Father   . Heart attack Father   . Alzheimer's disease Father   . Diabetes Maternal Grandmother   . Hypertension Mother   . Rectal cancer Mother 22  . Hypertension Brother     Social History   Socioeconomic History  . Marital status: Divorced    Spouse name: Not on file  . Number of children: 2  . Years of education: 2 years college  . Highest education level: Not on file  Occupational History  . Not on file  Social Needs  . Financial resource strain: Not hard at all  . Food insecurity:    Worry: Never true    Inability: Never true    . Transportation needs:    Medical: No    Non-medical: No  Tobacco Use  . Smoking status: Current Every Day Smoker    Packs/day: 0.50    Years: 2.00    Pack years: 1.00  . Smokeless tobacco: Never Used  . Tobacco comment: lives with a smoker  Substance and Sexual Activity  . Alcohol use: Yes    Alcohol/week: 14.0 standard drinks    Types: 14 Standard drinks or equivalent per week    Comment: a week  . Drug use: No  . Sexual activity: Yes    Partners: Male  Lifestyle  . Physical activity:    Days per week: 0 days    Minutes per session: 0 min  . Stress: Only a little  Relationships  . Social connections:    Talks on phone: Not on file    Gets together: Not on file    Attends religious service: Not on file    Active member of club or organization: Not on file    Attends meetings of clubs or organizations: Not on file    Relationship status: Not on file  . Intimate partner violence:    Fear of current or ex partner: Not on file  Emotionally abused: Not on file    Physically abused: Not on file    Forced sexual activity: Not on file  Other Topics Concern  . Not on file  Social History Narrative  . Not on file     Physical Exam  Vital Signs and Nursing Notes reviewed Vitals:   11/06/17 1732 11/06/17 2010  BP: 133/80 127/73  Pulse: 79 79  Resp: 16 16  Temp: 98.6 F (37 C) 99.2 F (37.3 C)  SpO2: 98% 98%    CONSTITUTIONAL: Well-appearing, NAD NEURO:  Alert and oriented x 3, no focal deficits EYES:  eyes equal and reactive ENT/NECK:  no LAD, no JVD CARDIO: Regular rate, well-perfused, normal S1 and S2 PULM:  CTAB no wheezing or rhonchi GI/GU:  normal bowel sounds, non-distended, significant right upper quadrant tenderness MSK/SPINE:  No gross deformities, no edema SKIN:  no rash, atraumatic PSYCH:  Appropriate speech and behavior  Diagnostic and Interventional Summary    EKG Interpretation  Date/Time:    Ventricular Rate:    PR Interval:    QRS  Duration:   QT Interval:    QTC Calculation:   R Axis:     Text Interpretation:        Labs Reviewed  COMPREHENSIVE METABOLIC PANEL - Abnormal; Notable for the following components:      Result Value   Glucose, Bld 108 (*)    Alkaline Phosphatase 129 (*)    All other components within normal limits  CBC - Abnormal; Notable for the following components:   WBC 17.0 (*)    RBC 5.16 (*)    Hemoglobin 16.8 (*)    HCT 50.0 (*)    All other components within normal limits  URINALYSIS, ROUTINE W REFLEX MICROSCOPIC - Abnormal; Notable for the following components:   Hgb urine dipstick MODERATE (*)    Ketones, ur 80 (*)    All other components within normal limits  LIPASE, BLOOD  HIV ANTIBODY (ROUTINE TESTING W REFLEX)  TYPE AND SCREEN  ABO/RH    US Abdomen Limited RUQ  Final Result      Medications  sertraline (ZOLOFT) tablet 100 mg (has no administration in time range)  traZODone (DESYREL) tablet 50 mg (has no administration in time range)  0.9 % NaCl with KCl 20 mEq/ L  infusion ( Intravenous New Bag/Given 11/06/17 2012)  piperacillin-tazobactam (ZOSYN) IVPB 3.375 g (has no administration in time range)  morphine 2 MG/ML injection 2-4 mg (2 mg Intravenous Given 11/06/17 2009)  ondansetron (ZOFRAN-ODT) disintegrating tablet 4 mg (has no administration in time range)    Or  ondansetron (ZOFRAN) injection 4 mg (has no administration in time range)  famotidine (PEPCID) IVPB 20 mg premix (has no administration in time range)  sodium chloride 0.9 % bolus 1,000 mL (0 mLs Intravenous Stopped 11/06/17 1647)  HYDROmorphone (DILAUDID) injection 0.5 mg (0.5 mg Intravenous Given 11/06/17 1558)  cefTRIAXone (ROCEPHIN) 2 g in sodium chloride 0.9 % 100 mL IVPB (0 g Intravenous Stopped 11/06/17 1726)  metroNIDAZOLE (FLAGYL) IVPB 500 mg (500 mg Intravenous New Bag/Given 11/06/17 1732)  sodium chloride 0.9 % bolus 1,000 mL (0 mLs Intravenous Stopped 11/06/17 1726)  HYDROmorphone (DILAUDID) injection  0.5 mg (0.5 mg Intravenous Given 11/06/17 1645)  heparin injection 5,000 Units (5,000 Units Subcutaneous Given 11/06/17 2009)     Procedures Critical Care Critical Care Documentation Critical care time provided by me (excluding procedures): 35 minutes  Condition necessitating critical care: Acute cholecystitis  Components of critical care management:  reviewing of prior records, laboratory and imaging interpretation, frequent re-examination and reassessment of vital signs, administration of IV antibiotics, IV fluids, discussion with consulting services.    ED Course and Medical Decision Making  I have reviewed the triage vital signs and the nursing notes.  Pertinent labs & imaging results that were available during my care of the patient were reviewed by me and considered in my medical decision making (see below for details).  Concern for cholecystitis in this 57 year old female, otherwise fairly healthy.  Labs, ultrasound, reassess.  Ultrasound confirms acute cholecystitis, given IV ceftriaxone and Flagyl, 2 L normal saline, Dilaudid for pain control, discussed case with general surgery, who will admit.  Elmer Sow. Pilar Plate, MD [facility] [email or web address]  Final Clinical Impressions(s) / ED Diagnoses     ICD-10-CM   1. Cholecystitis, acute K81.0   2. RUQ pain R10.11 US Abdomen Limited RUQ    US Abdomen Limited RUQ    ED Discharge Orders    None         Sabas Sous, MD 11/06/17 2056

## 2017-11-07 ENCOUNTER — Inpatient Hospital Stay (HOSPITAL_COMMUNITY): Payer: BLUE CROSS/BLUE SHIELD | Admitting: Anesthesiology

## 2017-11-07 ENCOUNTER — Encounter (HOSPITAL_COMMUNITY): Admission: EM | Disposition: A | Discharge status: Home / Self Care | Payer: Self-pay | Source: Home / Self Care

## 2017-11-07 ENCOUNTER — Inpatient Hospital Stay (HOSPITAL_COMMUNITY): Payer: BLUE CROSS/BLUE SHIELD

## 2017-11-07 ENCOUNTER — Encounter (HOSPITAL_COMMUNITY): Payer: Self-pay | Admitting: Registered Nurse

## 2017-11-07 HISTORY — PX: CHOLECYSTECTOMY: SHX55

## 2017-11-07 LAB — SURGICAL PCR SCREEN
MRSA, PCR: NEGATIVE
Staphylococcus aureus: NEGATIVE

## 2017-11-07 LAB — ABO/RH: ABO/RH(D): A NEG

## 2017-11-07 LAB — HIV ANTIBODY (ROUTINE TESTING W REFLEX): HIV SCREEN 4TH GENERATION: NONREACTIVE

## 2017-11-07 SURGERY — LAPAROSCOPIC CHOLECYSTECTOMY WITH INTRAOPERATIVE CHOLANGIOGRAM
Anesthesia: General | Site: Abdomen

## 2017-11-07 MED ORDER — LACTATED RINGERS IV SOLN
INTRAVENOUS | Status: DC | PRN
  Administered 2017-11-07: 12:00:00 via INTRAVENOUS

## 2017-11-07 MED ORDER — ACETAMINOPHEN 325 MG PO TABS: 650.0000 mg | ORAL_TABLET | Freq: Four times a day (QID) | ORAL | Status: DC | PRN

## 2017-11-07 MED ORDER — PROPOFOL 10 MG/ML IV BOLUS
INTRAVENOUS | Status: DC | PRN
  Administered 2017-11-07 (×2): 50 mg via INTRAVENOUS
  Administered 2017-11-07: 160 mg via INTRAVENOUS
  Administered 2017-11-07: 40 mg via INTRAVENOUS

## 2017-11-07 MED ORDER — 0.9 % SODIUM CHLORIDE (POUR BTL) OPTIME
TOPICAL | Status: DC | PRN
  Administered 2017-11-07: 1000 mL

## 2017-11-07 MED ORDER — ONDANSETRON 4 MG PO TBDP: 4.0000 mg | ORAL_TABLET | Freq: Four times a day (QID) | ORAL | Status: DC | PRN

## 2017-11-07 MED ORDER — MIDAZOLAM HCL 2 MG/2ML IJ SOLN
INTRAMUSCULAR | Status: AC
  Filled 2017-11-07: qty 2

## 2017-11-07 MED ORDER — SODIUM CHLORIDE 0.9 % IV SOLN
INTRAVENOUS | Status: DC
  Administered 2017-11-07: 11:00:00 via INTRAVENOUS

## 2017-11-07 MED ORDER — TRAMADOL HCL 50 MG PO TABS
50.0000 mg | ORAL_TABLET | Freq: Four times a day (QID) | ORAL | Status: DC | PRN
  Administered 2017-11-08: 50 mg via ORAL
  Filled 2017-11-07: qty 1

## 2017-11-07 MED ORDER — DEXAMETHASONE SODIUM PHOSPHATE 10 MG/ML IJ SOLN
INTRAMUSCULAR | Status: DC | PRN
  Administered 2017-11-07: 10 mg via INTRAVENOUS

## 2017-11-07 MED ORDER — FENTANYL CITRATE (PF) 100 MCG/2ML IJ SOLN
INTRAMUSCULAR | Status: DC | PRN
  Administered 2017-11-07 (×2): 100 ug via INTRAVENOUS
  Administered 2017-11-07: 50 ug via INTRAVENOUS
  Administered 2017-11-07: 100 ug via INTRAVENOUS
  Administered 2017-11-07 (×3): 50 ug via INTRAVENOUS

## 2017-11-07 MED ORDER — HYDROCODONE-ACETAMINOPHEN 5-325 MG PO TABS
1.0000 | ORAL_TABLET | ORAL | Status: DC | PRN
  Administered 2017-11-07 – 2017-11-08 (×3): 2 via ORAL
  Administered 2017-11-08: 1 via ORAL
  Administered 2017-11-08: 2 via ORAL
  Administered 2017-11-08: 1 via ORAL
  Administered 2017-11-08 – 2017-11-09 (×2): 2 via ORAL
  Filled 2017-11-07 (×6): qty 2
  Filled 2017-11-07 (×2): qty 1

## 2017-11-07 MED ORDER — MIDAZOLAM HCL 2 MG/2ML IJ SOLN: 1.0000 mg | INTRAMUSCULAR | Status: DC

## 2017-11-07 MED ORDER — DEXAMETHASONE SODIUM PHOSPHATE 10 MG/ML IJ SOLN
INTRAMUSCULAR | Status: AC
  Filled 2017-11-07: qty 1

## 2017-11-07 MED ORDER — MIDAZOLAM HCL 2 MG/2ML IJ SOLN: 0.5000 mg | Freq: Once | INTRAMUSCULAR | Status: DC | PRN

## 2017-11-07 MED ORDER — IOPAMIDOL (ISOVUE-300) INJECTION 61%
INTRAVENOUS | Status: AC
  Filled 2017-11-07: qty 50

## 2017-11-07 MED ORDER — ONDANSETRON HCL 4 MG/2ML IJ SOLN
INTRAMUSCULAR | Status: DC | PRN
  Administered 2017-11-07: 4 mg via INTRAVENOUS

## 2017-11-07 MED ORDER — KCL IN DEXTROSE-NACL 20-5-0.45 MEQ/L-%-% IV SOLN
INTRAVENOUS | Status: AC
  Filled 2017-11-07: qty 1000

## 2017-11-07 MED ORDER — HYDROMORPHONE HCL 1 MG/ML IJ SOLN
INTRAMUSCULAR | Status: AC
  Filled 2017-11-07: qty 1

## 2017-11-07 MED ORDER — ROCURONIUM BROMIDE 100 MG/10ML IV SOLN
INTRAVENOUS | Status: AC
  Filled 2017-11-07: qty 1

## 2017-11-07 MED ORDER — LIDOCAINE HCL (CARDIAC) PF 100 MG/5ML IV SOSY
PREFILLED_SYRINGE | INTRAVENOUS | Status: AC
  Filled 2017-11-07: qty 5

## 2017-11-07 MED ORDER — MEPERIDINE HCL 50 MG/ML IJ SOLN: 6.2500 mg | INTRAMUSCULAR | Status: DC | PRN

## 2017-11-07 MED ORDER — PROMETHAZINE HCL 25 MG/ML IJ SOLN: 6.2500 mg | INTRAMUSCULAR | Status: DC | PRN

## 2017-11-07 MED ORDER — SODIUM CHLORIDE 0.9 % IV SOLN
INTRAVENOUS | Status: DC | PRN
  Administered 2017-11-07: 10:00:00 via INTRAVENOUS

## 2017-11-07 MED ORDER — HYDROMORPHONE HCL 1 MG/ML IJ SOLN: 1.0000 mg | INTRAMUSCULAR | Status: DC | PRN

## 2017-11-07 MED ORDER — ACETAMINOPHEN 650 MG RE SUPP: 650.0000 mg | Freq: Four times a day (QID) | RECTAL | Status: DC | PRN

## 2017-11-07 MED ORDER — LACTATED RINGERS IV SOLN: INTRAVENOUS | Status: DC | PRN

## 2017-11-07 MED ORDER — LACTATED RINGERS IR SOLN
Status: DC | PRN
  Administered 2017-11-07: 2000 mL

## 2017-11-07 MED ORDER — SUGAMMADEX SODIUM 200 MG/2ML IV SOLN
INTRAVENOUS | Status: DC | PRN
  Administered 2017-11-07: 120 mg via INTRAVENOUS

## 2017-11-07 MED ORDER — ROCURONIUM BROMIDE 10 MG/ML (PF) SYRINGE
PREFILLED_SYRINGE | INTRAVENOUS | Status: DC | PRN
  Administered 2017-11-07: 35 mg via INTRAVENOUS

## 2017-11-07 MED ORDER — PROPOFOL 10 MG/ML IV BOLUS
INTRAVENOUS | Status: AC
  Filled 2017-11-07: qty 20

## 2017-11-07 MED ORDER — ONDANSETRON HCL 4 MG/2ML IJ SOLN: 4.0000 mg | Freq: Four times a day (QID) | INTRAMUSCULAR | Status: DC | PRN

## 2017-11-07 MED ORDER — BUPIVACAINE HCL (PF) 0.25 % IJ SOLN
INTRAMUSCULAR | Status: DC | PRN
  Administered 2017-11-07: 10 mL

## 2017-11-07 MED ORDER — FENTANYL CITRATE (PF) 250 MCG/5ML IJ SOLN
INTRAMUSCULAR | Status: AC
  Filled 2017-11-07: qty 5

## 2017-11-07 MED ORDER — HYDROMORPHONE HCL 1 MG/ML IJ SOLN
0.2500 mg | INTRAMUSCULAR | Status: DC | PRN
  Administered 2017-11-07 (×3): 0.25 mg via INTRAVENOUS

## 2017-11-07 MED ORDER — LIDOCAINE 2% (20 MG/ML) 5 ML SYRINGE
INTRAMUSCULAR | Status: DC | PRN
  Administered 2017-11-07: 25 mg via INTRAVENOUS
  Administered 2017-11-07: 75 mg via INTRAVENOUS

## 2017-11-07 MED ORDER — BUPIVACAINE HCL (PF) 0.25 % IJ SOLN
INTRAMUSCULAR | Status: AC
  Filled 2017-11-07: qty 30

## 2017-11-07 MED ORDER — SUCCINYLCHOLINE CHLORIDE 200 MG/10ML IV SOSY
PREFILLED_SYRINGE | INTRAVENOUS | Status: DC | PRN
  Administered 2017-11-07: 120 mg via INTRAVENOUS

## 2017-11-07 MED ORDER — MIDAZOLAM HCL 5 MG/5ML IJ SOLN
INTRAMUSCULAR | Status: DC | PRN
  Administered 2017-11-07: 2 mg via INTRAVENOUS

## 2017-11-07 MED ORDER — ONDANSETRON HCL 4 MG/2ML IJ SOLN
INTRAMUSCULAR | Status: AC
  Filled 2017-11-07: qty 2

## 2017-11-07 MED ORDER — KCL IN DEXTROSE-NACL 20-5-0.45 MEQ/L-%-% IV SOLN
INTRAVENOUS | Status: DC
  Administered 2017-11-07 – 2017-11-09 (×3): via INTRAVENOUS
  Filled 2017-11-07 (×2): qty 1000

## 2017-11-07 SURGICAL SUPPLY — 36 items
APPLIER CLIP ROT 10 11.4 M/L (STAPLE) ×2
APR CLP MED LRG 11.4X10 (STAPLE) ×1
BAG SPEC RTRVL LRG 6X4 10 (ENDOMECHANICALS) ×1
CABLE HIGH FREQUENCY MONO STRZ (ELECTRODE) ×2 IMPLANT
CHLORAPREP W/TINT 26ML (MISCELLANEOUS) ×4 IMPLANT
CLIP APPLIE ROT 10 11.4 M/L (STAPLE) ×1 IMPLANT
COVER MAYO STAND STRL (DRAPES) ×2 IMPLANT
COVER SURGICAL LIGHT HANDLE (MISCELLANEOUS) ×2 IMPLANT
DECANTER SPIKE VIAL GLASS SM (MISCELLANEOUS) ×2 IMPLANT
DRAIN CHANNEL 19F RND (DRAIN) ×1 IMPLANT
DRAPE C-ARM 42X120 X-RAY (DRAPES) ×2 IMPLANT
ELECT REM PT RETURN 15FT ADLT (MISCELLANEOUS) ×2 IMPLANT
GAUZE SPONGE 2X2 8PLY STRL LF (GAUZE/BANDAGES/DRESSINGS) ×1 IMPLANT
GLOVE SURG ORTHO 8.0 STRL STRW (GLOVE) ×2 IMPLANT
GOWN STRL REUS W/TWL XL LVL3 (GOWN DISPOSABLE) ×4 IMPLANT
HEMOSTAT SNOW SURGICEL 2X4 (HEMOSTASIS) ×1 IMPLANT
HEMOSTAT SURGICEL 4X8 (HEMOSTASIS) IMPLANT
KIT BASIN OR (CUSTOM PROCEDURE TRAY) ×2 IMPLANT
POUCH SPECIMEN RETRIEVAL 10MM (ENDOMECHANICALS) ×2 IMPLANT
SCISSORS LAP 5X35 DISP (ENDOMECHANICALS) ×2 IMPLANT
SET CHOLANGIOGRAPH MIX (MISCELLANEOUS) ×2 IMPLANT
SET IRRIG TUBING LAPAROSCOPIC (IRRIGATION / IRRIGATOR) ×2 IMPLANT
SLEEVE XCEL OPT CAN 5 100 (ENDOMECHANICALS) ×2 IMPLANT
SPONGE DRAIN TRACH 4X4 STRL 2S (GAUZE/BANDAGES/DRESSINGS) ×1 IMPLANT
SPONGE GAUZE 2X2 STER 10/PKG (GAUZE/BANDAGES/DRESSINGS) ×1
STRIP CLOSURE SKIN 1/2X4 (GAUZE/BANDAGES/DRESSINGS) ×2 IMPLANT
SUT MNCRL AB 4-0 PS2 18 (SUTURE) ×2 IMPLANT
SYR BULB IRRIGATION 50ML (SYRINGE) ×1 IMPLANT
TAPE CLOTH SURG 4X10 WHT LF (GAUZE/BANDAGES/DRESSINGS) ×1 IMPLANT
TOWEL OR 17X26 10 PK STRL BLUE (TOWEL DISPOSABLE) ×2 IMPLANT
TOWEL OR NON WOVEN STRL DISP B (DISPOSABLE) ×2 IMPLANT
TRAY LAPAROSCOPIC (CUSTOM PROCEDURE TRAY) ×2 IMPLANT
TROCAR BLADELESS OPT 5 100 (ENDOMECHANICALS) ×2 IMPLANT
TROCAR XCEL BLUNT TIP 100MML (ENDOMECHANICALS) ×2 IMPLANT
TROCAR XCEL NON-BLD 11X100MML (ENDOMECHANICALS) ×2 IMPLANT
TUBING INSUF HEATED (TUBING) IMPLANT

## 2017-11-07 NOTE — Anesthesia Preprocedure Evaluation (Addendum)
Anesthesia Evaluation  Patient identified by MRN, date of birth, ID band Patient awake    Reviewed: Allergy & Precautions, NPO status , Patient's Chart, lab work & pertinent test results  History of Anesthesia Complications Negative for: history of anesthetic complications  Airway Mallampati: I  TM Distance: >3 FB Neck ROM: Full    Dental  (+) Dental Advisory Given   Pulmonary Current Smoker,    breath sounds clear to auscultation       Cardiovascular negative cardio ROS   Rhythm:Regular Rate:Normal     Neuro/Psych Anxiety Depression negative neurological ROS     GI/Hepatic Neg liver ROS, GERD  Medicated and Controlled,  Endo/Other  negative endocrine ROSneg diabetes  Renal/GU negative Renal ROS     Musculoskeletal   Abdominal   Peds  Hematology negative hematology ROS (+)   Anesthesia Other Findings   Reproductive/Obstetrics S/p ablation                            Anesthesia Physical Anesthesia Plan  ASA: II  Anesthesia Plan: General   Post-op Pain Management:    Induction: Intravenous  PONV Risk Score and Plan: 2 and Ondansetron and Dexamethasone  Airway Management Planned: Oral ETT  Additional Equipment:   Intra-op Plan:   Post-operative Plan: Extubation in OR  Informed Consent: I have reviewed the patients History and Physical, chart, labs and discussed the procedure including the risks, benefits and alternatives for the proposed anesthesia with the patient or authorized representative who has indicated his/her understanding and acceptance.   Dental advisory given  Plan Discussed with: CRNA and Surgeon  Anesthesia Plan Comments: (Plan routine monitors, GETA)        Anesthesia Quick Evaluation

## 2017-11-07 NOTE — Anesthesia Postprocedure Evaluation (Signed)
Anesthesia Post Note  Patient: April Cook  Procedure(s) Performed: LAPAROSCOPIC CHOLECYSTECTOMY (N/A Abdomen)     Patient location during evaluation: PACU Anesthesia Type: General Level of consciousness: awake and alert, oriented and patient cooperative Pain management: pain level controlled Vital Signs Assessment: post-procedure vital signs reviewed and stable Respiratory status: spontaneous breathing, nonlabored ventilation and respiratory function stable Cardiovascular status: blood pressure returned to baseline and stable Postop Assessment: no apparent nausea or vomiting Anesthetic complications: no    Last Vitals:  Vitals:   11/07/17 1330 11/07/17 1353  BP: 131/66 133/74  Pulse: 76 75  Resp: 18   Temp: 36.9 C 36.8 C  SpO2: 96% 98%    Last Pain:  Vitals:   11/07/17 1353  TempSrc: Oral  PainSc:                  Reyansh Kushnir,E. Guynell Kleiber

## 2017-11-07 NOTE — Transfer of Care (Signed)
Immediate Anesthesia Transfer of Care Note  Patient: April Cook  Procedure(s) Performed: LAPAROSCOPIC CHOLECYSTECTOMY (N/A Abdomen)  Patient Location: PACU  Anesthesia Type:General  Level of Consciousness: awake, alert , oriented and patient cooperative  Airway & Oxygen Therapy: Patient Spontanous Breathing and Patient connected to face mask  Post-op Assessment: Report given to RN, Post -op Vital signs reviewed and stable and Patient moving all extremities X 4  Post vital signs: stable  Last Vitals:  Vitals Value Taken Time  BP    Temp    Pulse 85 11/07/2017 12:43 PM  Resp 15 11/07/2017 12:43 PM  SpO2 98 % 11/07/2017 12:43 PM  Vitals shown include unvalidated device data.  Last Pain:  Vitals:   11/07/17 0926  TempSrc: Oral  PainSc:       Patients Stated Pain Goal: 3 (11/07/17 0825)  Complications: No apparent anesthesia complications

## 2017-11-07 NOTE — Op Note (Signed)
Procedure Note  Pre-operative Diagnosis:  Acute cholecystitis, cholelithiasis  Post-operative Diagnosis:  same  Surgeon:  [name], MD  Assistant:  Romie Levee, MD   Procedure:  Laparoscopic cholecystectomy  Anesthesia:  General  Estimated Blood Loss:  minimal  Drains: 19Fr Blake to subhepatic space         Specimen: Gallbladder to pathology  Indications:  Pleasant generally healthy 57 year old female 2 days ago developed the onset of right upper quadrant abdominal pain.  Describes pain beneath the right costal margin radiating somewhat laterally.  The pain became severe and she had an episode of vomiting.  She tried to tough it out but last night had some low-grade fever and the pain persisted and she saw her primary physician today.  There was concern about cholecystitis and she was sent to the emergency room for further evaluation and imaging.  WBC 17K.  USN with thick walled gallbladder and stones impacted in the neck.  Now for cholecystectomy.  Procedure Details:  The patient was seen in the pre-op holding area. The risks, benefits, complications, treatment options, and expected outcomes were previously discussed with the patient. The patient agreed with the proposed plan and has signed the informed consent form.  The patient was brought to the Operating Room, identified as April Cook and the procedure verified as laparoscopic cholecystectomy with intraoperative cholangiography. A "time out" was completed and the above information confirmed.  The patient was placed in the supine position. Following induction of general anesthesia, the abdomen was prepped and draped in the usual aseptic fashion.  An incision was made in the skin near the umbilicus. The midline fascia was incised and the peritoneal cavity was entered and a Hasson canula was introduced under direct vision.  The Hasson canula was secured with a 0-Vicryl pursestring suture. Pneumoperitoneum was established with  carbon dioxide. Additional trocars were introduced under direct vision along the right costal margin in the midline, mid-clavicular line, and anterior axillary line.   The gallbladder was identified and the adherent omentum was mobilized off of the gallbladder.  The gallbladder was aspirated with a trocar and "milk bile" was noted.  The fundus  Was grasped and retracted cephalad. Adhesions were taken down bluntly and the electrocautery was utilized as needed, taking care not to injure any adjacent structures. The infundibulum was grasped and retracted laterally, exposing the peritoneum overlying the triangle of Calot. The peritoneum was incised and structures exposed with blunt dissection. The cystic duct was clearly identified, bluntly dissected circumferentially, and doubly clipped and divided. The cystic artery was identified, dissected circumferentially, ligated with ligaclips, and divided.  The gallbladder was dissected away from the liver bed using the electrocautery for hemostasis. The gallbladder was completely removed from the liver and placed into an endocatch bag. The gallbladder was removed in the endocatch bag through the umbilical port site and submitted to pathology for review.  The right upper quadrant was irrigated and the gallbladder bed was inspected. Hemostasis was achieved with the electrocautery.  April Cook was placed in the gallbladder bed.  A 19Fr Blake drain was placed in the subhepatic space and secured to the skin with a 3-0 Nylon suture.  Pneumoperitoneum was released after viewing removal of the trocars with good hemostasis noted. The umbilical wound was irrigated and the fascia was then closed with the pursestring suture.  Local anesthetic was infiltrated at all port sites. The skin incisions were closed with 4-0 Monocril subcuticular sutures and steri-strips and dressings were applied.  Instrument, sponge, and  needle counts were correct at the conclusion of the case.  The patient  was awakened from anesthesia and brought to the recovery room in stable condition.  The patient tolerated the procedure well.   [name], MD [facility] Office: [phone]

## 2017-11-07 NOTE — Anesthesia Procedure Notes (Signed)
Procedure Name: Intubation Date/Time: 11/07/2017 11:15 AM Performed by: Lissa Morales, CRNA Pre-anesthesia Checklist: Patient identified, Emergency Drugs available, Suction available and Patient being monitored Patient Re-evaluated:Patient Re-evaluated prior to induction Oxygen Delivery Method: Circle system utilized Preoxygenation: Pre-oxygenation with 100% oxygen Induction Type: IV induction, Cricoid Pressure applied and Rapid sequence Laryngoscope Size: Mac Grade View: Grade II Tube type: Oral Tube size: 7.0 mm Number of attempts: 1 Airway Equipment and Method: Stylet Placement Confirmation: ETT inserted through vocal cords under direct vision,  positive ETCO2 and breath sounds checked- equal and bilateral Secured at: 21 cm Tube secured with: Tape Dental Injury: Teeth and Oropharynx as per pre-operative assessment

## 2017-11-08 ENCOUNTER — Encounter (HOSPITAL_COMMUNITY): Payer: Self-pay | Admitting: Surgery

## 2017-11-08 LAB — COMPREHENSIVE METABOLIC PANEL
ALK PHOS: 148 U/L — AB (ref 38–126)
ALT: 41 U/L (ref 0–44)
AST: 46 U/L — AB (ref 15–41)
Albumin: 3 g/dL — ABNORMAL LOW (ref 3.5–5.0)
Anion gap: 9 (ref 5–15)
CO2: 27 mmol/L (ref 22–32)
CREATININE: 0.53 mg/dL (ref 0.44–1.00)
Calcium: 9 mg/dL (ref 8.9–10.3)
Chloride: 108 mmol/L (ref 98–111)
GFR calc Af Amer: >60 mL/min (ref >60)
GFR calc non Af Amer: >60 mL/min (ref >60)
GLUCOSE: 170 mg/dL — AB (ref 70–99)
Potassium: 4.7 mmol/L (ref 3.5–5.1)
Sodium: 144 mmol/L (ref 135–145)
Total Bilirubin: 1.2 mg/dL (ref 0.3–1.2)
Total Protein: 6.2 g/dL — ABNORMAL LOW (ref 6.5–8.1)

## 2017-11-08 LAB — CBC
HCT: 43 % (ref 36.0–46.0)
Hemoglobin: 13.9 g/dL (ref 12.0–15.0)
MCH: 31.6 pg (ref 26.0–34.0)
MCHC: 32.3 g/dL (ref 30.0–36.0)
MCV: 97.7 fL (ref 78.0–100.0)
PLATELETS: 245 10*3/uL (ref 150–400)
RBC: 4.4 MIL/uL (ref 3.87–5.11)
RDW: 13.8 % (ref 11.5–15.5)
WBC: 11.1 10*3/uL — AB (ref 4.0–10.5)

## 2017-11-08 MED ORDER — TRAZODONE HCL 50 MG PO TABS
50.0000 mg | ORAL_TABLET | Freq: Every day | ORAL | Status: DC
  Administered 2017-11-08: 50 mg via ORAL
  Filled 2017-11-08: qty 1

## 2017-11-08 MED ORDER — SERTRALINE HCL 100 MG PO TABS
100.0000 mg | ORAL_TABLET | Freq: Every day | ORAL | Status: DC
  Administered 2017-11-08 – 2017-11-09 (×2): 100 mg via ORAL
  Filled 2017-11-08 (×2): qty 1

## 2017-11-08 MED ORDER — LORAZEPAM 0.5 MG PO TABS
0.5000 mg | ORAL_TABLET | Freq: Every day | ORAL | Status: DC | PRN
  Administered 2017-11-08: 0.5 mg via ORAL
  Filled 2017-11-08: qty 1

## 2017-11-08 NOTE — Plan of Care (Signed)
Pt stable with no needs. No changes to note. Pt tolerating clear liquids diet well. No changes to note. Rn medicating pt for pain.

## 2017-11-08 NOTE — Progress Notes (Signed)
Assessment & Plan: POD#1 - status post lap chole for acute cholecystitis  Advance to regular diet  Monitor drain output for one more day - labs normal this AM  OOB, ambulate  Restart home meds  Continue IV Zosyn today - will not need abx's on discharge  Likely home tomorrow        [name], MD       [facility]       Office: [phone]   Chief Complaint: Acute cholecystitis  Subjective: Patient in bed, comfortable.  Tolerating clear liquids.  Ambulatory.  Wants home meds restarted.  Objective: Vital signs in last 24 hours: Temp:  [97.7 F (36.5 C)-99.1 F (37.3 C)] 97.7 F (36.5 C) (10/06 0555) Pulse Rate:  [55-88] 65 (10/06 0555) Resp:  [14-18] 16 (10/06 0555) BP: (104-165)/(63-83) 131/82 (10/06 0555) SpO2:  [94 %-98 %] 98 % (10/06 0555) Last BM Date: 11/06/17  Intake/Output from previous day: 10/05 0701 - 10/06 0700 In: 2667.2 [P.O.:80; I.V.:2587.2] Out: 1035 [Urine:900; Drains:85; Blood:50] Intake/Output this shift: No intake/output data recorded.  Physical Exam: HEENT - sclerae clear, mucous membranes moist Neck - soft Chest - clear bilaterally Cor - RRR Abdomen - soft, protuberant; BS present; drain with thin serosanguinous; small bleed around drain site Ext - no edema, non-tender Neuro - alert & oriented, no focal deficits  Lab Results:  Recent Labs    11/06/17 1300 11/08/17 0425  WBC 17.0* 11.1*  HGB 16.8* 13.9  HCT 50.0* 43.0  PLT 265 245   BMET Recent Labs    11/06/17 1300 11/08/17 0425  NA 139 144  K 3.6 4.7  CL 100 108  CO2 26 27  GLUCOSE 108* 170*  BUN 8 <5*  CREATININE 0.70 0.53  CALCIUM 9.1 9.0   PT/INR No results for input(s): LABPROT, INR in the last 72 hours. Comprehensive Metabolic Panel:    Component Value Date/Time   NA 144 11/08/2017 0425   NA 139 11/06/2017 1300   NA 139 06/23/2016 1004   K 4.7 11/08/2017 0425   K 3.6 11/06/2017 1300   CL 108 11/08/2017 0425   CL 100 11/06/2017  1300   CO2 27 11/08/2017 0425   CO2 26 11/06/2017 1300   BUN <5 (L) 11/08/2017 0425   BUN 8 11/06/2017 1300   BUN 17 06/23/2016 1004   CREATININE 0.53 11/08/2017 0425   CREATININE 0.70 11/06/2017 1300   GLUCOSE 170 (H) 11/08/2017 0425   GLUCOSE 108 (H) 11/06/2017 1300   CALCIUM 9.0 11/08/2017 0425   CALCIUM 9.1 11/06/2017 1300   AST 46 (H) 11/08/2017 0425   AST 37 11/06/2017 1300   ALT 41 11/08/2017 0425   ALT 42 11/06/2017 1300   ALKPHOS 148 (H) 11/08/2017 0425   ALKPHOS 129 (H) 11/06/2017 1300   BILITOT 1.2 11/08/2017 0425   BILITOT 1.0 11/06/2017 1300   BILITOT 0.5 06/23/2016 1004   PROT 6.2 (L) 11/08/2017 0425   PROT 7.3 11/06/2017 1300   PROT 7.1 06/23/2016 1004   ALBUMIN 3.0 (L) 11/08/2017 0425   ALBUMIN 4.1 11/06/2017 1300   ALBUMIN 5.1 06/23/2016 1004    Studies/Results: US Abdomen Limited Ruq  Result Date: 11/06/2017 CLINICAL DATA:  Right upper quadrant pain for 2 days EXAM: ULTRASOUND ABDOMEN LIMITED RIGHT UPPER QUADRANT COMPARISON:  None. FINDINGS: Gallbladder: Layering and shadowing gallstones, 1 fixed in the neck. There is also dependent debris/sludge. Gallbladder is full and the wall measures up to 4 mm in thickness. Some  striations and pericholecystic edema is present. Sonographic Murphy sign per technologist Common bile duct: Diameter: 5 mm.  Where visualized, no filling defect. Liver: No focal lesion identified. Within normal limits in parenchymal echogenicity. Portal vein is patent on color Doppler imaging with normal direction of blood flow towards the liver. IMPRESSION: Findings of acute calculus cholecystitis. Electronically Signed   By: Marnee Spring M.D.   On: 11/06/2017 15:58      April Cook M 11/08/2017  Patient ID: April Cook, female   DOB: 01-24-1961, 57 y.o.   MRN: [id number]

## 2017-11-09 MED ORDER — HYDROCODONE-ACETAMINOPHEN 5-325 MG PO TABS: 1.0000 | ORAL_TABLET | ORAL | 0 refills | Status: DC | PRN

## 2017-11-09 NOTE — Discharge Summary (Signed)
Patient ID: April Cook [id number] [birth year] 57 y.o.  Admit date: 11/06/2017 Discharge date: 11/09/2017  Admitting Diagnosis: Acute cholecystitis  Discharge Diagnosis Patient Active Problem List   Diagnosis Date Noted  . Cholelithiasis and acute cholecystitis without obstruction 11/06/2017  . Depression, recurrent (HCC) 06/23/2016  . Insomnia 12/02/2013    Consultants none  Reason for Admission: Pleasant generally healthy 57 year old female 2 days ago developed the onset of right upper quadrant abdominal pain.  Describes pain beneath the right costal margin radiating somewhat laterally.  The pain became severe and she had an episode of vomiting.  She tried to tough it out but last night had some low-grade fever and the pain persisted and she saw her primary physician today.  There was concern about cholecystitis and she was sent to the emergency room for further evaluation and imaging.  The pain is worse with any motion.  Continues to have some nausea.  No jaundice noted.  No history of any similar symptoms.  No previous abdominal surgery.  Procedures Laparoscopic cholecystectomy, Dr. Gerrit Friends 11-07-17  Hospital Course:  The patient was admitted and placed on abx therapy.  She was taken to the OR the following day where she was found to have acute cholecystitis.  She had a drain placed during surgery.  On POD 1, her diet was able to be advanced as tolerated.  Her JP drain was left in place to follow output and contents.  This remained under 25cc/24hrs and contents were serosang in nature.  This was able to be removed on POD 2.  She had good pain control at this time, voiding, and mobilizing as well.  She was stable for DC home.    Physical Exam: Abd: soft, JP drain removed with no issues, +BS, appropriately tender, all sites are intact.  Umbilical site has ecchymosis extending inferiorly.  No evidence of erythema or warmth to suspect an infection.  Allergies as of 11/09/2017   No  Known Allergies     Medication List    TAKE these medications   HYDROcodone-acetaminophen 5-325 MG tablet Commonly known as:  NORCO/VICODIN Take 1 tablet by mouth every 4 (four) hours as needed for moderate pain.   ibuprofen 200 MG tablet Commonly known as:  ADVIL,MOTRIN Take 800 mg by mouth every 6 (six) hours as needed for moderate pain.   LORazepam 0.5 MG tablet Commonly known as:  ATIVAN Take 1-2 tablets by mouth daily as needed for anxiety.   pantoprazole 40 MG tablet Commonly known as:  PROTONIX Take 1 tablet (40 mg total) by mouth daily.   PEPTO-BISMOL 262 MG/15ML suspension Generic drug:  bismuth subsalicylate Take 30 mLs by mouth every 6 (six) hours as needed for indigestion.   ranitidine 150 MG tablet Commonly known as:  ZANTAC Take 75-150 mg by mouth 2 (two) times daily as needed for heartburn.   sertraline 100 MG tablet Commonly known as:  ZOLOFT Take 100 mg by mouth daily.   traZODone 50 MG tablet Commonly known as:  DESYREL Take 50 mg by mouth at bedtime.        Follow-up Information    [facility] Follow up in 3 week(s).   Specialty:  General Surgery Why:  our office will contact you with appointment date and time Contact information: [address] [place] [address] [phone]          Signed: Barnetta Chapel, [facility] 11/09/2017, 9:16 AM Pager: [phone]

## 2017-11-09 NOTE — Discharge Instructions (Signed)
Please arrive at least 30 min before your appointment to complete your check in paperwork.  If you are unable to arrive 30 min prior to your appointment time we may have to cancel or reschedule you.  LAPAROSCOPIC SURGERY: POST OP INSTRUCTIONS  1. DIET: Follow a light bland diet the first 24 hours after arrival home, such as soup, liquids, crackers, etc. Be sure to include lots of fluids daily. Avoid fast food or heavy meals as your are more likely to get nauseated. Eat a low fat the next few days after surgery.  2. Take your usually prescribed home medications unless otherwise directed. 3. PAIN CONTROL:  1. Pain is best controlled by a usual combination of three different methods TOGETHER:  i. Ice/Heat ii. Over the counter pain medication iii. Prescription pain medication 2. Most patients will experience some swelling and bruising around the incisions. Ice packs or heating pads (30-60 minutes up to 6 times a day) will help. Use ice for the first few days to help decrease swelling and bruising, then switch to heat to help relax tight/sore spots and speed recovery. Some people prefer to use ice alone, heat alone, alternating between ice & heat. Experiment to what works for you. Swelling and bruising can take several weeks to resolve.  3. It is helpful to take an over-the-counter pain medication regularly for the first few weeks. Choose one of the following that works best for you:  i. Naproxen (Aleve, etc) Two 220mg  tabs twice a day ii. Ibuprofen (Advil, etc) Three 200mg  tabs four times a day (every meal & bedtime) iii. Acetaminophen (Tylenol, etc) 500-650mg  four times a day (every meal & bedtime) 4. A prescription for pain medication (such as oxycodone, hydrocodone, etc) should be given to you upon discharge. Take your pain medication as prescribed.  i. If you are having problems/concerns with the prescription medicine (does not control pain, nausea, vomiting, rash, itching, etc), please call us [phone]  [phone] to see if we need to switch you to a different pain medicine that will work better for you and/or control your side effect better. ii. If you need a refill on your pain medication, please contact your pharmacy. They will contact our office to request authorization. Prescriptions will not be filled after 5 pm or on week-ends. 1. Avoid getting constipated. Between the surgery and the pain medications, it is common to experience some constipation. Increasing fluid intake and taking a fiber supplement (such as Metamucil, Citrucel, FiberCon, MiraLax, etc) 1-2 times a day regularly will usually help prevent this problem from occurring. A mild laxative (prune juice, Milk of Magnesia, MiraLax, etc) should be taken according to package directions if there are no bowel movements after 48 hours.  2. Watch out for diarrhea. If you have many loose bowel movements, simplify your diet to bland foods & liquids for a few days. Stop any stool softeners and decrease your fiber supplement. Switching to mild anti-diarrheal medications (Kayopectate, Pepto Bismol) can help. If this worsens or does not improve, please call us. 3. Wash / shower every day. You may shower over the dressings as they are waterproof. Continue to shower over incision(s) after the dressing is off. 4. Remove your waterproof bandages 5 days after surgery. You may leave the incision open to air. You may replace a dressing/Band-Aid to cover the incision for comfort if you wish.  5. ACTIVITIES as tolerated:  a. You may resume regular (light) daily activities beginning the next day--such as daily self-care, walking, climbing stairs--gradually  increasing activities as tolerated. If you can walk 30 minutes without difficulty, it is safe to try more intense activity such as jogging, treadmill, bicycling, low-impact aerobics, swimming, etc. °b. Save the most intensive and strenuous activity for last such as sit-ups, heavy lifting, contact sports, etc Refrain  from any heavy lifting or straining until you are off narcotics for pain control.  °c. DO NOT PUSH THROUGH PAIN. Let pain be your guide: If it hurts to do something, don't do it. Pain is your body warning you to avoid that activity for another week until the pain goes down. °d. You may drive when you are no longer taking prescription pain medication, you can comfortably wear a seatbelt, and you can safely maneuver your car and apply brakes. °e. You may have sexual intercourse when it is comfortable.  °6. FOLLOW UP in our office  °a. Please call [facility] at [phone] to set up an appointment to see your surgeon in the office for a follow-up appointment approximately 2-3 weeks after your surgery. °b. Make sure that you call for this appointment the day you arrive home to insure a convenient appointment time. °     10. IF YOU HAVE DISABILITY OR FAMILY LEAVE FORMS, BRING THEM TO THE               OFFICE FOR PROCESSING.  ° °WHEN TO CALL US [phone]:  °1. Poor pain control °2. Reactions / problems with new medications (rash/itching, nausea, etc)  °3. Fever over 101.5 F (38.5 C) °4. Inability to urinate °5. Nausea and/or vomiting °6. Worsening swelling or bruising °7. Continued bleeding from incision. °8. Increased pain, redness, or drainage from the incision ° °The clinic staff is available to answer your questions during regular business hours (8:30am-5pm). Please don’t hesitate to call and ask to speak to one of our nurses for clinical concerns.  °If you have a medical emergency, go to the nearest emergency room or call 911.  °A surgeon from [facility] is always on call at the hospitals  ° °[facility]  °[address], [address], [place], [place] [address] ?  °MAIN: [phone] ? TOLL FREE: [phone] ?  °FAX [phone]  °[email or web address] ° °

## 2017-12-24 ENCOUNTER — Telehealth: Payer: Self-pay | Admitting: Physician Assistant

## 2017-12-25 ENCOUNTER — Other Ambulatory Visit: Payer: Self-pay | Admitting: Psychiatry

## 2018-01-28 ENCOUNTER — Encounter: Payer: Self-pay | Admitting: Emergency Medicine

## 2018-01-28 DIAGNOSIS — F411 Generalized anxiety disorder: Secondary | ICD-10-CM

## 2018-01-28 DIAGNOSIS — F4010 Social phobia, unspecified: Secondary | ICD-10-CM | POA: Insufficient documentation

## 2018-02-16 ENCOUNTER — Encounter: Payer: Self-pay | Admitting: Psychiatry

## 2018-02-16 ENCOUNTER — Ambulatory Visit: Payer: BLUE CROSS/BLUE SHIELD | Admitting: Psychiatry

## 2018-02-16 ENCOUNTER — Encounter

## 2018-02-16 DIAGNOSIS — F341 Dysthymic disorder: Secondary | ICD-10-CM

## 2018-02-16 DIAGNOSIS — F3342 Major depressive disorder, recurrent, in full remission: Secondary | ICD-10-CM | POA: Diagnosis not present

## 2018-02-16 DIAGNOSIS — F411 Generalized anxiety disorder: Secondary | ICD-10-CM

## 2018-02-16 DIAGNOSIS — F4010 Social phobia, unspecified: Secondary | ICD-10-CM | POA: Diagnosis not present

## 2018-02-16 MED ORDER — LORAZEPAM 0.5 MG PO TABS: ORAL_TABLET | ORAL | 1 refills | Status: DC

## 2018-02-16 NOTE — Progress Notes (Signed)
April Cook [id number] [birth year] 58 y.o.  Subjective:   Patient ID:  April Cook is a 58 y.o. (DOB 08-Apr-1960) female.  Chief Complaint:  Chief Complaint  Patient presents with  . Follow-up    Medication management    HPI April Cook presents to the office today for follow-up of depression and anxiety.  Last seen July.  Pretty good.  Using less Ativan overall.  Patient reports stable mood and denies depressed or irritable moods.  Patient denies any recent difficulty with anxiety.  Patient denies difficulty with sleep initiation or maintenance. Denies appetite disturbance.  Patient reports that energy and motivation have been good.  Patient denies any difficulty with concentration.  Patient denies any suicidal ideation.  Son getting married this year.  Failed attempt to decrease Zoloft to 75 mg early 2019 due to weepiness.  Prior psych med trials include Benadryl and melatonin for sleep, buspirone increased depression Lexapro 20, methyl folate, aripiprazole 15 mg, venlafaxine 300 mg, Wellbutrin XL 450 mg, sertraline, Xanax, trazodone,  Review of Systems:  Review of Systems  Neurological: Negative for tremors and weakness.  Psychiatric/Behavioral: Negative for agitation, behavioral problems, confusion, decreased concentration, dysphoric mood, hallucinations, self-injury, sleep disturbance and suicidal ideas. The patient is not nervous/anxious and is not hyperactive.     Medications: I have reviewed the patient's current medications.  Current Outpatient Medications  Medication Sig Dispense Refill  . LORazepam (ATIVAN) 0.5 MG tablet TAKE 1-2 TABLETS BY MOUTH TWICE DAILY AS NEEDED FOR ANXIETY 180 tablet 0  . sertraline (ZOLOFT) 100 MG tablet Take 100 mg by mouth daily.     . traZODone (DESYREL) 50 MG tablet Take 50 mg by mouth at bedtime.     . bismuth subsalicylate (PEPTO-BISMOL) 262 MG/15ML suspension Take 30 mLs by mouth every 6 (six) hours as needed for indigestion.    Marland Kitchen  HYDROcodone-acetaminophen (NORCO/VICODIN) 5-325 MG tablet Take 1 tablet by mouth every 4 (four) hours as needed for moderate pain. (Patient not taking: Reported on 02/16/2018) 20 tablet 0  . ibuprofen (ADVIL,MOTRIN) 200 MG tablet Take 800 mg by mouth every 6 (six) hours as needed for moderate pain.     . pantoprazole (PROTONIX) 40 MG tablet Take 1 tablet (40 mg total) by mouth daily. (Patient not taking: Reported on 02/16/2018) 30 tablet 3  . ranitidine (ZANTAC) 150 MG tablet Take 75-150 mg by mouth 2 (two) times daily as needed for heartburn.     No current facility-administered medications for this visit.     Medication Side Effects: None  Allergies: No Known Allergies  Past Medical History:  Diagnosis Date  . Anemia   . Anxiety   . Depression   . GERD (gastroesophageal reflux disease)   . Gestational diabetes   . 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 Microscopic hematuria    h/o-negative UA  . Pneumonia     Family History  Problem Relation Age of Onset  . Hypertension Father   . Heart attack Father   . Alzheimer's disease Father   . Diabetes Maternal Grandmother   . Hypertension Mother   . Rectal cancer Mother 50  . Hypertension Brother     Social History   Socioeconomic History  . Marital status: Divorced    Spouse name: Not on file  . Number of children: 2  . Years of education: 2 years college  . Highest education level: Not on file  Occupational History  . Not on file  Social Needs  . Financial resource strain: Not hard  at all  . Food insecurity:    Worry: Never true    Inability: Never true  . Transportation needs:    Medical: No    Non-medical: No  Tobacco Use  . Smoking status: Current Every Day Smoker    Packs/day: 0.50    Years: 2.00    Pack years: 1.00  . Smokeless tobacco: Never Used  . Tobacco comment: lives with a smoker  Substance and Sexual Activity  . Alcohol use: Yes    Alcohol/week: 14.0 standard drinks    Types: 14 Standard drinks or equivalent  per week    Comment: a week  . Drug use: No  . Sexual activity: Yes    Partners: Male  Lifestyle  . Physical activity:    Days per week: 0 days    Minutes per session: 0 min  . Stress: Only a little  Relationships  . Social connections:    Talks on phone: Not on file    Gets together: Not on file    Attends religious service: Not on file    Active member of club or organization: Not on file    Attends meetings of clubs or organizations: Not on file    Relationship status: Not on file  . Intimate partner violence:    Fear of current or ex partner: Not on file    Emotionally abused: Not on file    Physically abused: Not on file    Forced sexual activity: Not on file  Other Topics Concern  . Not on file  Social History Narrative  . Not on file    Past Medical History, Surgical history, Social history, and Family history were reviewed and updated as appropriate.   Please see review of systems for further details on the patient's review from today.   Objective:   Physical Exam:  There were no vitals taken for this visit.  Physical Exam Constitutional:      General: She is not in acute distress.    Appearance: She is well-developed.  Musculoskeletal:        General: No deformity.  Neurological:     Mental Status: She is alert and oriented to person, place, and time.     Motor: No tremor.     Coordination: Coordination normal.     Gait: Gait normal.  Psychiatric:        Attention and Perception: Attention and perception normal.        Mood and Affect: Mood is not anxious or depressed. Affect is not labile, blunt, angry or inappropriate.        Speech: Speech normal.        Behavior: Behavior normal.        Thought Content: Thought content normal. Thought content does not include homicidal or suicidal ideation. Thought content does not include homicidal or suicidal plan.        Cognition and Memory: Cognition normal.        Judgment: Judgment normal.     Comments:  Insight intact. No auditory or visual hallucinations. No delusions.      Lab Review:     Component Value Date/Time   NA 144 11/08/2017 0425   NA 139 06/23/2016 1004   K 4.7 11/08/2017 0425   CL 108 11/08/2017 0425   CO2 27 11/08/2017 0425   GLUCOSE 170 (H) 11/08/2017 0425   BUN <5 (L) 11/08/2017 0425   BUN 17 06/23/2016 1004   CREATININE 0.53 11/08/2017 0425   CALCIUM  9.0 11/08/2017 0425   PROT 6.2 (L) 11/08/2017 0425   PROT 7.1 06/23/2016 1004   ALBUMIN 3.0 (L) 11/08/2017 0425   ALBUMIN 5.1 06/23/2016 1004   AST 46 (H) 11/08/2017 0425   ALT 41 11/08/2017 0425   ALKPHOS 148 (H) 11/08/2017 0425   BILITOT 1.2 11/08/2017 0425   BILITOT 0.5 06/23/2016 1004   GFRNONAA >60 11/08/2017 0425   GFRAA >60 11/08/2017 0425       Component Value Date/Time   WBC 11.1 (H) 11/08/2017 0425   RBC 4.40 11/08/2017 0425   HGB 13.9 11/08/2017 0425   HGB 15.2 06/23/2016 1004   HGB 14.3 12/02/2013 1048   HCT 43.0 11/08/2017 0425   HCT 45.2 06/23/2016 1004   PLT 245 11/08/2017 0425   PLT 320 06/23/2016 1004   MCV 97.7 11/08/2017 0425   MCV 93 06/23/2016 1004   MCH 31.6 11/08/2017 0425   MCHC 32.3 11/08/2017 0425   RDW 13.8 11/08/2017 0425   RDW 14.1 06/23/2016 1004   LYMPHSABS 2.6 06/23/2016 1004   EOSABS 0.1 06/23/2016 1004   BASOSABS 0.1 06/23/2016 1004    No results found for: POCLITH, LITHIUM   No results found for: PHENYTOIN, PHENOBARB, VALPROATE, CBMZ   .res Assessment: Plan:    Generalized anxiety disorder  Social anxiety disorder  Recurrent major depression in full remission (HCC)  Dysthymia   Symptoms are under good control.  No changes indicated.  We discussed the short-term risks associated with benzodiazepines including sedation and increased fall risk among others.  Discussed long-term side effect risk including dependence, potential withdrawal symptoms, and the potential eventual dose-related risk of dementia.  FU 6 mos  Meredith Staggersarey Cottle, MD,  DFAPA   Please see After Visit Summary for patient specific instructions.  No future appointments.  No orders of the defined types were placed in this encounter.     -------------------------------

## 2018-04-05 ENCOUNTER — Other Ambulatory Visit: Payer: Self-pay | Admitting: Psychiatry

## 2018-05-28 ENCOUNTER — Other Ambulatory Visit: Payer: Self-pay | Admitting: Psychiatry

## 2018-08-24 ENCOUNTER — Encounter: Payer: Self-pay | Admitting: Psychiatry

## 2018-08-24 ENCOUNTER — Ambulatory Visit (INDEPENDENT_AMBULATORY_CARE_PROVIDER_SITE_OTHER): Payer: BC Managed Care – PPO | Admitting: Psychiatry

## 2018-08-24 ENCOUNTER — Other Ambulatory Visit: Payer: Self-pay

## 2018-08-24 DIAGNOSIS — F341 Dysthymic disorder: Secondary | ICD-10-CM

## 2018-08-24 DIAGNOSIS — F4010 Social phobia, unspecified: Secondary | ICD-10-CM

## 2018-08-24 DIAGNOSIS — F3342 Major depressive disorder, recurrent, in full remission: Secondary | ICD-10-CM

## 2018-08-24 DIAGNOSIS — F411 Generalized anxiety disorder: Secondary | ICD-10-CM | POA: Diagnosis not present

## 2018-08-24 NOTE — Progress Notes (Signed)
April Cook [id number] 08-18-1960 58 y.o.  Subjective:   Patient ID:  April Cook is a 58 y.o. (DOB 10-30-1960) female.  Chief Complaint:  Chief Complaint  Patient presents with  . Follow-up    Medication Management  . Anxiety    Medication Management  . Depression    Medication Management    Anxiety Patient reports no confusion, decreased concentration, nervous/anxious behavior or suicidal ideas.    Depression        Associated symptoms include no decreased concentration and no suicidal ideas.  Past medical history includes anxiety.    April Cook presents to the office today for follow-up of depression and anxiety.  Last seen January.  Pretty good.  At first with Covid got more anxious but better now.  Using less Ativan overall...average now 1 daily.  Patient reports stable mood and denies depressed or irritable moods.  Patient denies any recent difficulty with anxiety.  Patient denies difficulty with sleep initiation or maintenance. Denies appetite disturbance.  Patient reports that energy and motivation have been good.  Patient denies any difficulty with concentration.  Patient denies any suicidal ideation. Lost 18#.  Son getting married this year.  Moved in with her temporarily and she's doing less night eating and gardening activity.  Failed attempt to decrease Zoloft to 75 mg early 2019 due to weepiness.  Prior psych med trials include Benadryl and melatonin for sleep, buspirone increased depression Lexapro 20, methyl folate, aripiprazole 15 mg, venlafaxine 300 mg, Wellbutrin XL 450 mg, sertraline, Xanax, trazodone,  Review of Systems:  Review of Systems  Neurological: Negative for tremors and weakness.  Psychiatric/Behavioral: Positive for depression. Negative for agitation, behavioral problems, confusion, decreased concentration, dysphoric mood, hallucinations, self-injury, sleep disturbance and suicidal ideas. The patient is not nervous/anxious and is not  hyperactive.   Depression is not present.  That note in error.  Medications: I have reviewed the patient's current medications.  Current Outpatient Medications  Medication Sig Dispense Refill  . bismuth subsalicylate (PEPTO-BISMOL) 262 MG/15ML suspension Take 30 mLs by mouth every 6 (six) hours as needed for indigestion.    Marland Kitchen ibuprofen (ADVIL,MOTRIN) 200 MG tablet Take 800 mg by mouth every 6 (six) hours as needed for moderate pain.     Marland Kitchen LORazepam (ATIVAN) 0.5 MG tablet TAKE 1 TO 2 TABLETS BY MOUTH TWICE DAILY AS NEEDED FOR ANXIETY 180 tablet 1  . sertraline (ZOLOFT) 100 MG tablet Take 100 mg by mouth daily.     . traZODone (DESYREL) 50 MG tablet TAKE 1 TO 2 TABLETS BY MOUTH AT BEDTIME AS NEEDED FOR  INSOMNIA 180 tablet 1   No current facility-administered medications for this visit.     Medication Side Effects: None  Allergies: No Known Allergies  Past Medical History:  Diagnosis Date  . Anemia   . Anxiety   . Depression   . GERD (gastroesophageal reflux disease)   . Gestational diabetes   . 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 Microscopic hematuria    h/o-negative UA  . Pneumonia     Family History  Problem Relation Age of Onset  . Hypertension Father   . Heart attack Father   . Alzheimer's disease Father   . Diabetes Maternal Grandmother   . Hypertension Mother   . Rectal cancer Mother 78  . Hypertension Brother     Social History   Socioeconomic History  . Marital status: Divorced    Spouse name: Not on file  . Number of children: 2  . Years  of education: 2 years college  . Highest education level: Not on file  Occupational History  . Not on file  Social Needs  . Financial resource strain: Not hard at all  . Food insecurity    Worry: Never true    Inability: Never true  . Transportation needs    Medical: No    Non-medical: No  Tobacco Use  . Smoking status: Current Every Day Smoker    Packs/day: 0.50    Years: 2.00    Pack years: 1.00  . Smokeless tobacco:  Never Used  . Tobacco comment: lives with a smoker  Substance and Sexual Activity  . Alcohol use: Yes    Alcohol/week: 14.0 standard drinks    Types: 14 Standard drinks or equivalent per week    Comment: a week  . Drug use: No  . Sexual activity: Yes    Partners: Male  Lifestyle  . Physical activity    Days per week: 0 days    Minutes per session: 0 min  . Stress: Only a little  Relationships  . Social Musicianconnections    Talks on phone: Not on file    Gets together: Not on file    Attends religious service: Not on file    Active member of club or organization: Not on file    Attends meetings of clubs or organizations: Not on file    Relationship status: Not on file  . Intimate partner violence    Fear of current or ex partner: Not on file    Emotionally abused: Not on file    Physically abused: Not on file    Forced sexual activity: Not on file  Other Topics Concern  . Not on file  Social History Narrative  . Not on file    Past Medical History, Surgical history, Social history, and Family history were reviewed and updated as appropriate.   Please see review of systems for further details on the patient's review from today.   Objective:   Physical Exam:  There were no vitals taken for this visit.  Physical Exam Constitutional:      General: She is not in acute distress.    Appearance: She is well-developed.  Musculoskeletal:        General: No deformity.  Neurological:     Mental Status: She is alert and oriented to person, place, and time.     Motor: No tremor.     Coordination: Coordination normal.     Gait: Gait normal.  Psychiatric:        Attention and Perception: Attention and perception normal.        Mood and Affect: Mood is anxious. Mood is not depressed. Affect is not labile, blunt, angry or inappropriate.        Speech: Speech normal.        Behavior: Behavior normal.        Thought Content: Thought content normal. Thought content does not include  homicidal or suicidal ideation. Thought content does not include homicidal or suicidal plan.        Cognition and Memory: Cognition normal.        Judgment: Judgment normal.     Comments: Insight intact. No auditory or visual hallucinations. No delusions.  Anxiety is manageable with meds.     Lab Review:     Component Value Date/Time   NA 144 11/08/2017 0425   NA 139 06/23/2016 1004   K 4.7 11/08/2017 0425   CL 108  11/08/2017 0425   CO2 27 11/08/2017 0425   GLUCOSE 170 (H) 11/08/2017 0425   BUN <5 (L) 11/08/2017 0425   BUN 17 06/23/2016 1004   CREATININE 0.53 11/08/2017 0425   CALCIUM 9.0 11/08/2017 0425   PROT 6.2 (L) 11/08/2017 0425   PROT 7.1 06/23/2016 1004   ALBUMIN 3.0 (L) 11/08/2017 0425   ALBUMIN 5.1 06/23/2016 1004   AST 46 (H) 11/08/2017 0425   ALT 41 11/08/2017 0425   ALKPHOS 148 (H) 11/08/2017 0425   BILITOT 1.2 11/08/2017 0425   BILITOT 0.5 06/23/2016 1004   GFRNONAA >60 11/08/2017 0425   GFRAA >60 11/08/2017 0425       Component Value Date/Time   WBC 11.1 (H) 11/08/2017 0425   RBC 4.40 11/08/2017 0425   HGB 13.9 11/08/2017 0425   HGB 15.2 06/23/2016 1004   HGB 14.3 12/02/2013 1048   HCT 43.0 11/08/2017 0425   HCT 45.2 06/23/2016 1004   PLT 245 11/08/2017 0425   PLT 320 06/23/2016 1004   MCV 97.7 11/08/2017 0425   MCV 93 06/23/2016 1004   MCH 31.6 11/08/2017 0425   MCHC 32.3 11/08/2017 0425   RDW 13.8 11/08/2017 0425   RDW 14.1 06/23/2016 1004   LYMPHSABS 2.6 06/23/2016 1004   EOSABS 0.1 06/23/2016 1004   BASOSABS 0.1 06/23/2016 1004    No results found for: POCLITH, LITHIUM   No results found for: PHENYTOIN, PHENOBARB, VALPROATE, CBMZ   .res Assessment: Plan:    Darl PikesSusan was seen today for follow-up, anxiety and depression.  Diagnoses and all orders for this visit:  Generalized anxiety disorder  Social anxiety disorder  Recurrent major depression in full remission (HCC)  Dysthymia   Symptoms are under good control.  No changes  indicated.  We discussed the short-term risks associated with benzodiazepines including sedation and increased fall risk among others.  Discussed long-term side effect risk including dependence, potential withdrawal symptoms, and the potential eventual dose-related risk of dementia.  FU 9 mos  Meredith Staggersarey Cottle, MD, DFAPA   Please see After Visit Summary for patient specific instructions.  No future appointments.  No orders of the defined types were placed in this encounter.     -------------------------------

## 2018-09-23 ENCOUNTER — Ambulatory Visit (INDEPENDENT_AMBULATORY_CARE_PROVIDER_SITE_OTHER): Payer: BC Managed Care – PPO | Admitting: *Deleted

## 2018-09-23 ENCOUNTER — Other Ambulatory Visit: Payer: Self-pay

## 2018-09-23 DIAGNOSIS — Z23 Encounter for immunization: Secondary | ICD-10-CM | POA: Diagnosis not present

## 2018-09-23 NOTE — Progress Notes (Signed)
Pt given Tdap vaccine Tolerated well 

## 2018-09-29 ENCOUNTER — Other Ambulatory Visit: Payer: Self-pay | Admitting: Psychiatry

## 2018-10-07 ENCOUNTER — Other Ambulatory Visit: Payer: Self-pay

## 2018-10-07 MED ORDER — LORAZEPAM 0.5 MG PO TABS: ORAL_TABLET | ORAL | 1 refills | Status: DC

## 2019-04-05 ENCOUNTER — Other Ambulatory Visit: Payer: Self-pay

## 2019-04-05 MED ORDER — LORAZEPAM 0.5 MG PO TABS: ORAL_TABLET | ORAL | 0 refills | Status: DC

## 2019-05-25 ENCOUNTER — Ambulatory Visit (INDEPENDENT_AMBULATORY_CARE_PROVIDER_SITE_OTHER): Payer: BC Managed Care – PPO | Admitting: Psychiatry

## 2019-05-25 ENCOUNTER — Other Ambulatory Visit: Payer: Self-pay

## 2019-05-25 ENCOUNTER — Encounter: Payer: Self-pay | Admitting: Psychiatry

## 2019-05-25 DIAGNOSIS — F411 Generalized anxiety disorder: Secondary | ICD-10-CM

## 2019-05-25 DIAGNOSIS — F341 Dysthymic disorder: Secondary | ICD-10-CM

## 2019-05-25 DIAGNOSIS — F3342 Major depressive disorder, recurrent, in full remission: Secondary | ICD-10-CM

## 2019-05-25 DIAGNOSIS — F5105 Insomnia due to other mental disorder: Secondary | ICD-10-CM

## 2019-05-25 DIAGNOSIS — F4010 Social phobia, unspecified: Secondary | ICD-10-CM | POA: Diagnosis not present

## 2019-05-25 MED ORDER — TRAZODONE HCL 50 MG PO TABS: ORAL_TABLET | ORAL | 3 refills | Status: DC

## 2019-05-25 MED ORDER — SERTRALINE HCL 100 MG PO TABS: 100.0000 mg | ORAL_TABLET | Freq: Every day | ORAL | 1 refills | Status: DC

## 2019-05-25 MED ORDER — LORAZEPAM 0.5 MG PO TABS: ORAL_TABLET | ORAL | 0 refills | Status: DC

## 2019-05-25 NOTE — Progress Notes (Signed)
April Cook [id number] March 25, 1960 59 y.o.  Subjective:   Patient ID:  April Cook is a 59 y.o. (DOB Jun 15, 1960) female.  Chief Complaint:  Chief Complaint  Patient presents with  . Follow-up  . Depression  . Anxiety    Anxiety Patient reports no confusion, decreased concentration, nervous/anxious behavior or suicidal ideas.    Depression        Associated symptoms include no decreased concentration and no suicidal ideas.  Past medical history includes anxiety.    April Cook presents to the office today for follow-up of depression and anxiety.    Last seen July 2020.  No meds were changed  More depressed over holidays and increase sertraline to 150 mg for 4 weeks without help and felt more agitated and restless, jittery. Depression is better now.  Outside with planting.  Still upset over H leaving her for another woman. History seasonal depression.  Pretty good.  At first with Covid got more anxious but better now.  Using less Ativan overall...average now 1 daily.  Patient reports stable mood and denies depressed or irritable moods.  Patient denies any recent difficulty with anxiety except a little in the morning. Only take lorazepam 0.5 mg 3-4 AM/week..  Patient denies difficulty with sleep initiation or maintenance. Denies appetite disturbance.  Patient reports that energy and motivation have been good.  Patient denies any difficulty with concentration.  Patient denies any suicidal ideation. Lost 18#.  Son got married this year.   She has aquarium with assassin snails.  Failed attempt to decrease Zoloft to 75 mg early 2019 due to weepiness.  Prior psych med trials include Benadryl and melatonin for sleep, buspirone increased depression Lexapro 20, methyl folate, aripiprazole 15 mg, venlafaxine 300 mg, Wellbutrin XL 450 mg, sertraline, Xanax, trazodone,  Review of Systems:  Review of Systems  Neurological: Negative for tremors and weakness.  Psychiatric/Behavioral:  Positive for depression. Negative for agitation, behavioral problems, confusion, decreased concentration, dysphoric mood, hallucinations, self-injury, sleep disturbance and suicidal ideas. The patient is not nervous/anxious and is not hyperactive.   Depression is not present.  That note in error.  Medications: I have reviewed the patient's current medications.  Current Outpatient Medications  Medication Sig Dispense Refill  . bismuth subsalicylate (PEPTO-BISMOL) 262 MG/15ML suspension Take 30 mLs by mouth every 6 (six) hours as needed for indigestion.    Marland Kitchen ibuprofen (ADVIL,MOTRIN) 200 MG tablet Take 800 mg by mouth every 6 (six) hours as needed for moderate pain.     Marland Kitchen LORazepam (ATIVAN) 0.5 MG tablet TAKE 1 TO 2 TABLETS BY MOUTH TWICE DAILY AS NEEDED FOR ANXIETY 180 tablet 0  . sertraline (ZOLOFT) 100 MG tablet Take 1 tablet (100 mg total) by mouth daily. 90 tablet 1  . traZODone (DESYREL) 50 MG tablet TAKE 1 TO 2 TABLETS BY MOUTH AT BEDTIME AS NEEDED FOR INSOMNIA 180 tablet 3   No current facility-administered medications for this visit.    Medication Side Effects: None  Allergies: No Known Allergies  Past Medical History:  Diagnosis Date  . Anemia   . Anxiety   . Depression   . GERD (gastroesophageal reflux disease)   . Gestational diabetes   . 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 Microscopic hematuria    h/o-negative UA  . Pneumonia     Family History  Problem Relation Age of Onset  . Hypertension Father   . Heart attack Father   . Alzheimer's disease Father   . Diabetes Maternal Grandmother   . Hypertension  Mother   . Rectal cancer Mother 80  . Hypertension Brother     Social History   Socioeconomic History  . Marital status: Divorced    Spouse name: Not on file  . Number of children: 2  . Years of education: 2 years college  . Highest education level: Not on file  Occupational History  . Not on file  Tobacco Use  . Smoking status: Current Every Day Smoker     Packs/day: 0.50    Years: 2.00    Pack years: 1.00  . Smokeless tobacco: Never Used  . Tobacco comment: lives with a smoker  Substance and Sexual Activity  . Alcohol use: Yes    Alcohol/week: 14.0 standard drinks    Types: 14 Standard drinks or equivalent per week    Comment: a week  . Drug use: No  . Sexual activity: Yes    Partners: Male  Other Topics Concern  . Not on file  Social History Narrative  . Not on file   Social Determinants of Health   Financial Resource Strain:   . Difficulty of Paying Living Expenses:   Food Insecurity:   . Worried About Charity fundraiser in the Last Year:   . Arboriculturist in the Last Year:   Transportation Needs:   . Film/video editor (Medical):   Marland Kitchen Lack of Transportation (Non-Medical):   Physical Activity:   . Days of Exercise per Week:   . Minutes of Exercise per Session:   Stress:   . Feeling of Stress :   Social Connections:   . Frequency of Communication with Friends and Family:   . Frequency of Social Gatherings with Friends and Family:   . Attends Religious Services:   . Active Member of Clubs or Organizations:   . Attends Archivist Meetings:   Marland Kitchen Marital Status:   Intimate Partner Violence:   . Fear of Current or Ex-Partner:   . Emotionally Abused:   Marland Kitchen Physically Abused:   . Sexually Abused:     Past Medical History, Surgical history, Social history, and Family history were reviewed and updated as appropriate.   Please see review of systems for further details on the patient's review from today.   Objective:   Physical Exam:  There were no vitals taken for this visit.  Physical Exam Constitutional:      General: She is not in acute distress.    Appearance: She is well-developed.  Musculoskeletal:        General: No deformity.  Neurological:     Mental Status: She is alert and oriented to person, place, and time.     Motor: No tremor.     Coordination: Coordination normal.     Gait: Gait  normal.  Psychiatric:        Attention and Perception: Attention and perception normal.        Mood and Affect: Mood is not anxious or depressed. Affect is not labile, blunt, angry or inappropriate.        Speech: Speech normal.        Behavior: Behavior normal.        Thought Content: Thought content normal. Thought content does not include homicidal or suicidal ideation. Thought content does not include homicidal or suicidal plan.        Cognition and Memory: Cognition normal.        Judgment: Judgment normal.     Comments: Insight intact. No auditory or visual  hallucinations. No delusions.  Anxiety is manageable with meds.     Lab Review:     Component Value Date/Time   NA 144 11/08/2017 0425   NA 139 06/23/2016 1004   K 4.7 11/08/2017 0425   CL 108 11/08/2017 0425   CO2 27 11/08/2017 0425   GLUCOSE 170 (H) 11/08/2017 0425   BUN <5 (L) 11/08/2017 0425   BUN 17 06/23/2016 1004   CREATININE 0.53 11/08/2017 0425   CALCIUM 9.0 11/08/2017 0425   PROT 6.2 (L) 11/08/2017 0425   PROT 7.1 06/23/2016 1004   ALBUMIN 3.0 (L) 11/08/2017 0425   ALBUMIN 5.1 06/23/2016 1004   AST 46 (H) 11/08/2017 0425   ALT 41 11/08/2017 0425   ALKPHOS 148 (H) 11/08/2017 0425   BILITOT 1.2 11/08/2017 0425   BILITOT 0.5 06/23/2016 1004   GFRNONAA >60 11/08/2017 0425   GFRAA >60 11/08/2017 0425       Component Value Date/Time   WBC 11.1 (H) 11/08/2017 0425   RBC 4.40 11/08/2017 0425   HGB 13.9 11/08/2017 0425   HGB 15.2 06/23/2016 1004   HGB 14.3 12/02/2013 1048   HCT 43.0 11/08/2017 0425   HCT 45.2 06/23/2016 1004   PLT 245 11/08/2017 0425   PLT 320 06/23/2016 1004   MCV 97.7 11/08/2017 0425   MCV 93 06/23/2016 1004   MCH 31.6 11/08/2017 0425   MCHC 32.3 11/08/2017 0425   RDW 13.8 11/08/2017 0425   RDW 14.1 06/23/2016 1004   LYMPHSABS 2.6 06/23/2016 1004   EOSABS 0.1 06/23/2016 1004   BASOSABS 0.1 06/23/2016 1004    No results found for: POCLITH, LITHIUM   No results found for:  PHENYTOIN, PHENOBARB, VALPROATE, CBMZ   .res Assessment: Plan:    Joana was seen today for follow-up, depression and anxiety.  Diagnoses and all orders for this visit:  Generalized anxiety disorder -     LORazepam (ATIVAN) 0.5 MG tablet; TAKE 1 TO 2 TABLETS BY MOUTH TWICE DAILY AS NEEDED FOR ANXIETY -     sertraline (ZOLOFT) 100 MG tablet; Take 1 tablet (100 mg total) by mouth daily.  Social anxiety disorder  Recurrent major depression in full remission (HCC) -     sertraline (ZOLOFT) 100 MG tablet; Take 1 tablet (100 mg total) by mouth daily.  Dysthymia -     sertraline (ZOLOFT) 100 MG tablet; Take 1 tablet (100 mg total) by mouth daily.  Insomnia due to mental condition -     traZODone (DESYREL) 50 MG tablet; TAKE 1 TO 2 TABLETS BY MOUTH AT BEDTIME AS NEEDED FOR INSOMNIA   Recent seasonal depression resolved.  Didn't tolerate sertraline 150. Continue sertraline 100 mg daily. Sy mptoms are under good control.  No changes indicated.  We discussed the short-term risks associated with benzodiazepines including sedation and increased fall risk among others.  Discussed long-term side effect risk including dependence, potential withdrawal symptoms, and the potential eventual dose-related risk of dementia.  FU 9 mos  Meredith Staggers, MD, DFAPA   Please see After Visit Summary for patient specific instructions.  No future appointments.  No orders of the defined types were placed in this encounter.     -------------------------------

## 2019-06-07 ENCOUNTER — Other Ambulatory Visit: Payer: Self-pay | Admitting: Psychiatry

## 2019-06-07 DIAGNOSIS — F341 Dysthymic disorder: Secondary | ICD-10-CM

## 2019-06-07 DIAGNOSIS — F411 Generalized anxiety disorder: Secondary | ICD-10-CM

## 2019-06-07 DIAGNOSIS — F3342 Major depressive disorder, recurrent, in full remission: Secondary | ICD-10-CM

## 2019-09-13 ENCOUNTER — Other Ambulatory Visit: Payer: Self-pay | Admitting: Psychiatry

## 2019-09-13 DIAGNOSIS — F411 Generalized anxiety disorder: Secondary | ICD-10-CM

## 2019-09-15 NOTE — Telephone Encounter (Signed)
Next apt next year 05/2020

## 2019-12-19 ENCOUNTER — Other Ambulatory Visit: Payer: Self-pay | Admitting: Psychiatry

## 2019-12-19 DIAGNOSIS — F411 Generalized anxiety disorder: Secondary | ICD-10-CM

## 2020-01-25 IMAGING — US US ABDOMEN LIMITED
1 series · 14 of 25 positions shown · non-contrast
Comparison: None.

CLINICAL DATA: Right upper quadrant pain for 2 days

EXAM:
ULTRASOUND ABDOMEN LIMITED RIGHT UPPER QUADRANT

[Series 1: us abdomen limited · 14 of 61 slices shown]
[im 1/61]
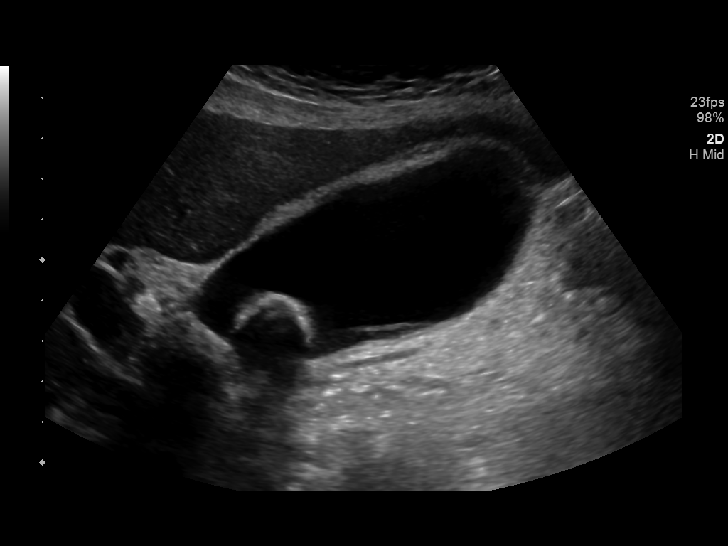
[im 6/61]
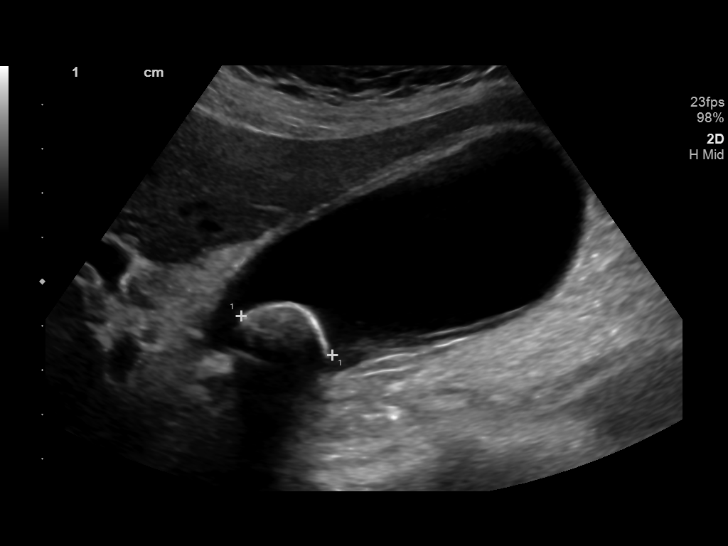
[im 11/61]
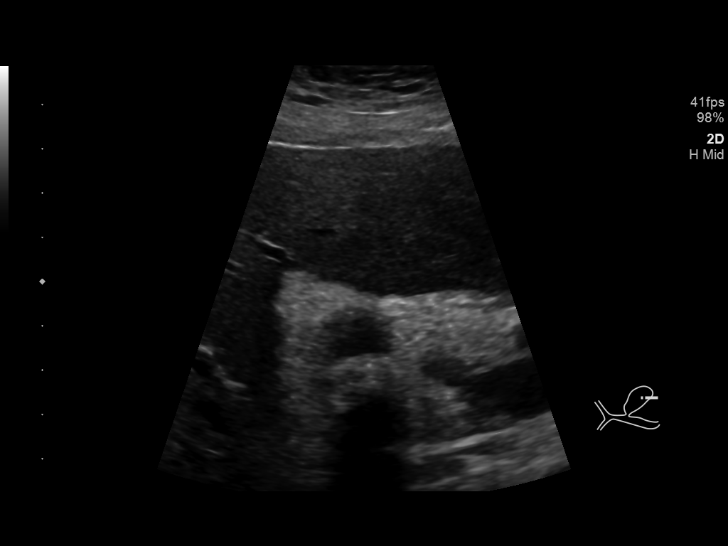
[im 16/61]
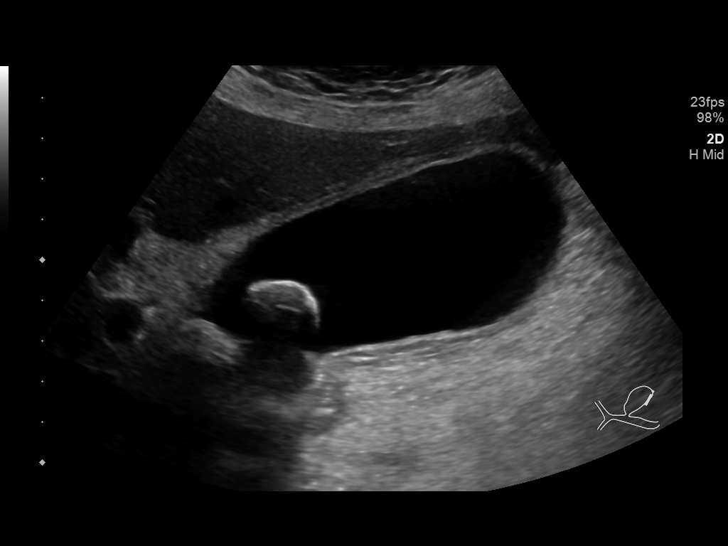
[im 21/61]
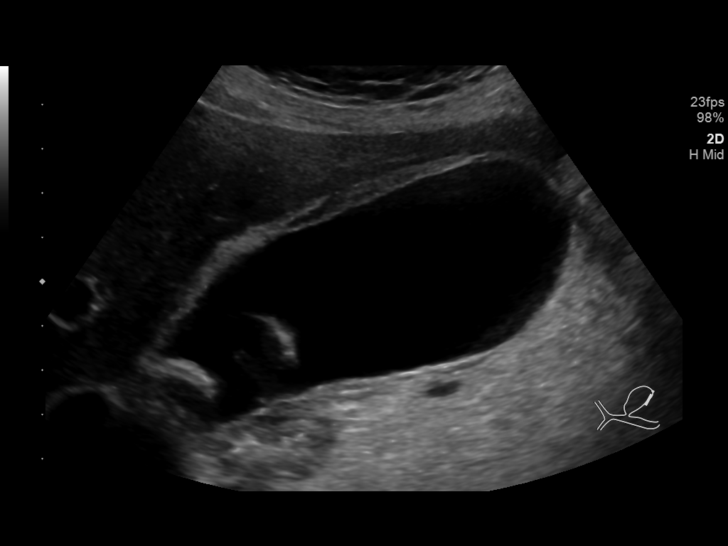
[im 23/61]
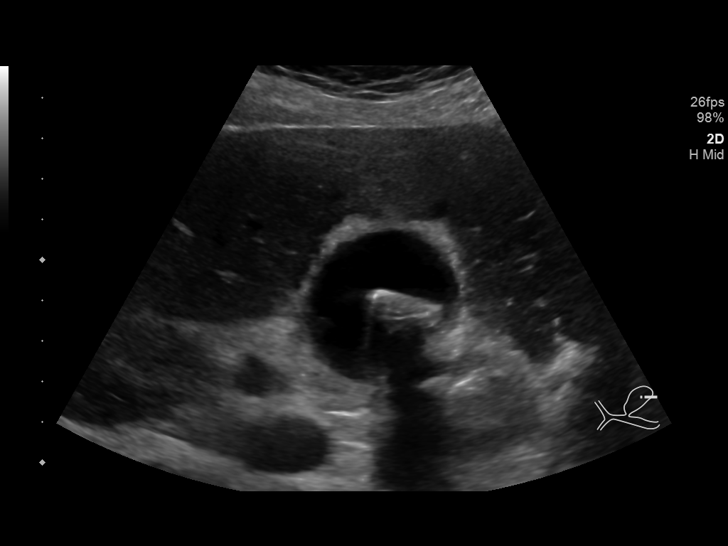
[im 28/61]
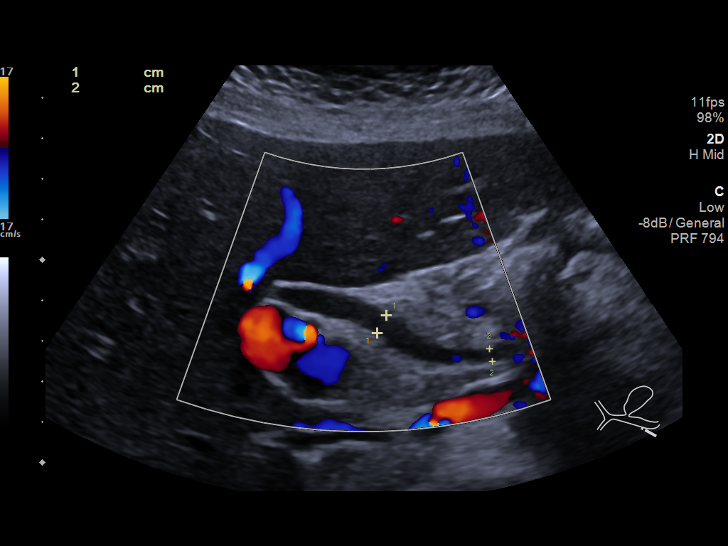
[im 33/61]
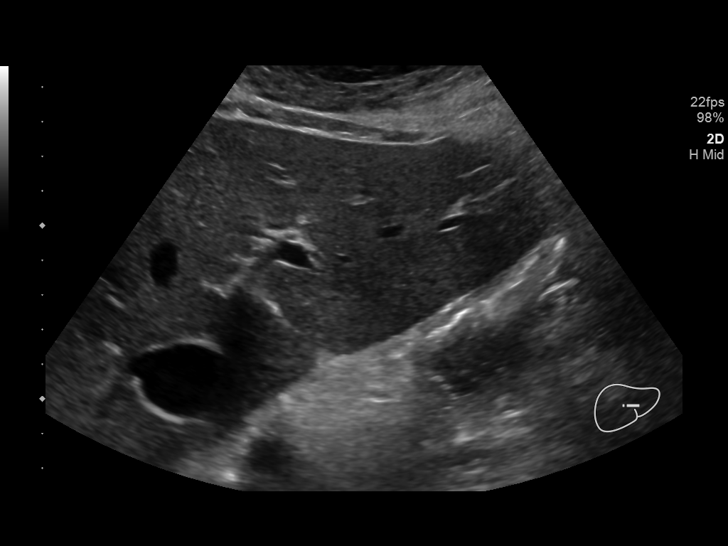
[im 38/61]
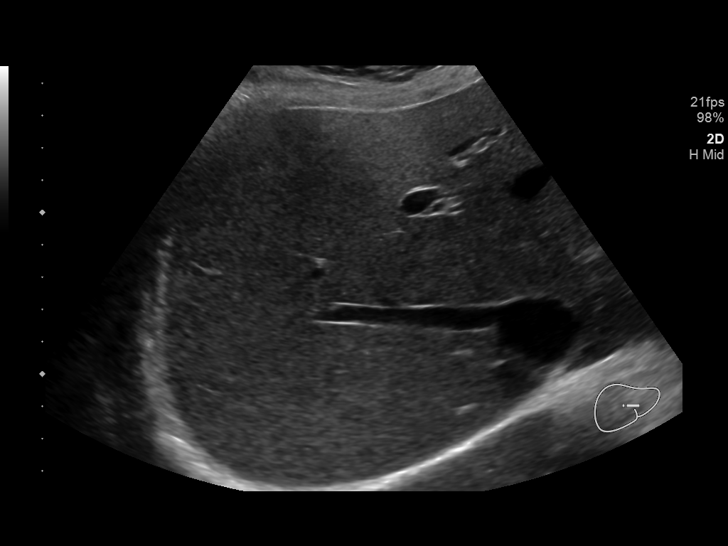
[im 41/61]
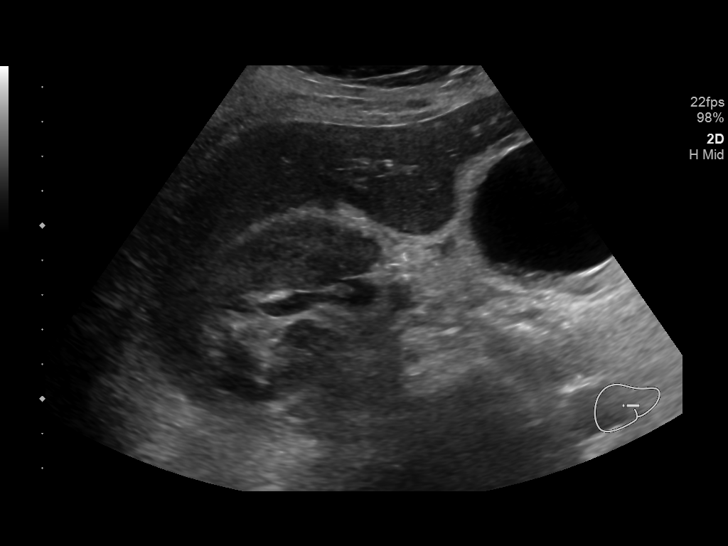
[im 46/61]
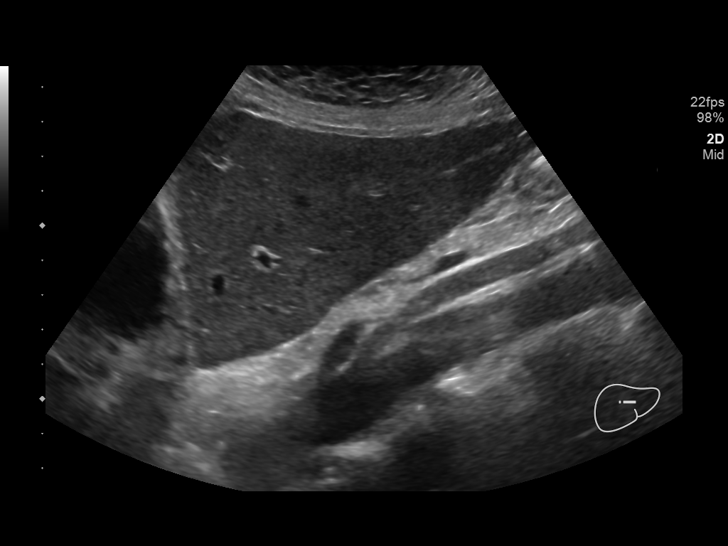
[im 51/61]
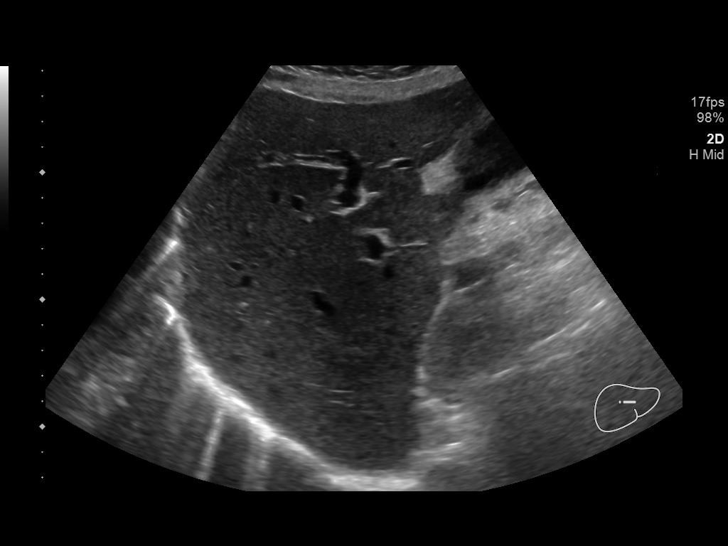
[im 56/61]
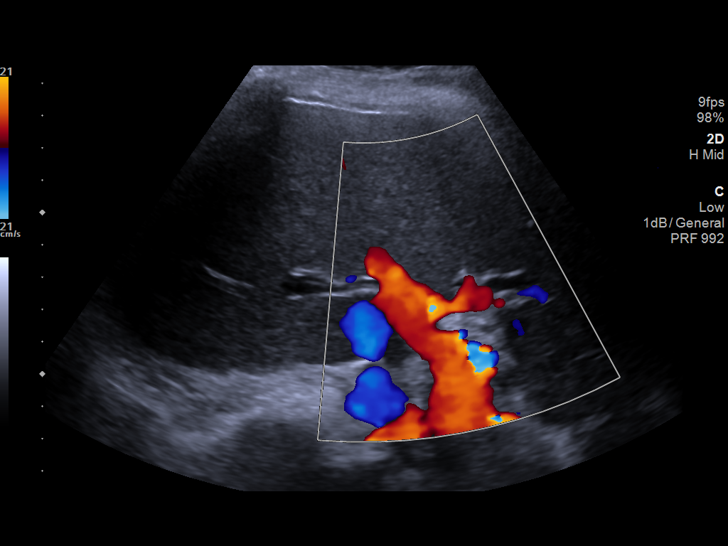
[im 61/61]
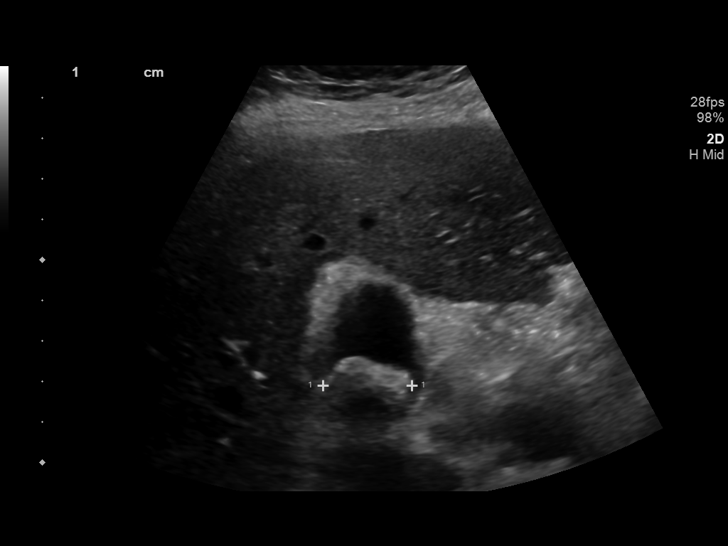

[14 of 25 positions shown; findings below may reference images not displayed]

FINDINGS: Gallbladder:

Layering and shadowing gallstones, 1 fixed in the neck. There is
also dependent debris/sludge. Gallbladder is full and the wall
measures up to 4 mm in thickness. Some striations and
pericholecystic edema is present. Sonographic Murphy sign per
technologist

Common bile duct:

Diameter: 5 mm.  Where visualized, no filling defect.

Liver:

No focal lesion identified. Within normal limits in parenchymal
echogenicity. Portal vein is patent on color Doppler imaging with
normal direction of blood flow towards the liver.
IMPRESSION: Findings of acute calculus cholecystitis.

## 2020-04-03 ENCOUNTER — Other Ambulatory Visit: Payer: Self-pay | Admitting: Psychiatry

## 2020-04-03 DIAGNOSIS — F411 Generalized anxiety disorder: Secondary | ICD-10-CM

## 2020-05-23 ENCOUNTER — Other Ambulatory Visit: Payer: Self-pay

## 2020-05-23 ENCOUNTER — Encounter: Payer: Self-pay | Admitting: Psychiatry

## 2020-05-23 ENCOUNTER — Ambulatory Visit (INDEPENDENT_AMBULATORY_CARE_PROVIDER_SITE_OTHER): Payer: BC Managed Care – PPO | Admitting: Psychiatry

## 2020-05-23 DIAGNOSIS — F4010 Social phobia, unspecified: Secondary | ICD-10-CM

## 2020-05-23 DIAGNOSIS — F3342 Major depressive disorder, recurrent, in full remission: Secondary | ICD-10-CM

## 2020-05-23 DIAGNOSIS — F341 Dysthymic disorder: Secondary | ICD-10-CM | POA: Diagnosis not present

## 2020-05-23 DIAGNOSIS — F411 Generalized anxiety disorder: Secondary | ICD-10-CM | POA: Diagnosis not present

## 2020-05-23 DIAGNOSIS — F5105 Insomnia due to other mental disorder: Secondary | ICD-10-CM

## 2020-05-23 MED ORDER — TRAZODONE HCL 50 MG PO TABS: ORAL_TABLET | ORAL | 3 refills | Status: DC

## 2020-05-23 MED ORDER — SERTRALINE HCL 100 MG PO TABS: 1.0000 | ORAL_TABLET | Freq: Every day | ORAL | 3 refills | Status: DC

## 2020-05-23 MED ORDER — LORAZEPAM 0.5 MG PO TABS: ORAL_TABLET | ORAL | 1 refills | Status: DC

## 2020-05-23 NOTE — Progress Notes (Signed)
April Cook [id number] [birth year] 60 y.o.  Subjective:   Patient ID:  April Cook is a 60 y.o. (DOB [birth year]) female.  Chief Complaint:  Chief Complaint  Patient presents with  . Follow-up  . Generalized anxiety disorder    Anxiety Patient reports no confusion, decreased concentration, nervous/anxious behavior, palpitations or suicidal ideas.    Depression        Associated symptoms include no decreased concentration and no suicidal ideas.  Past medical history includes anxiety.    April Cook presents to the office today for follow-up of depression and anxiety.    seen July 2020.  No meds were changed  05/2019 appt noted: More depressed over holidays and increase sertraline to 150 mg for 4 weeks without help and felt more agitated and restless, jittery. Depression is better now.  Outside with planting.  Still upset over H leaving her for another woman. History seasonal depression. Pretty good.  At first with Covid got more anxious but better now.  Using less Ativan overall...average now 1 daily.  Patient reports stable mood and denies depressed or irritable moods.  Patient denies any recent difficulty with anxiety except a little in the morning. Only take lorazepam 0.5 mg 3-4 AM/week..  Patient denies difficulty with sleep initiation or maintenance. Denies appetite disturbance.  Patient reports that energy and motivation have been good.  Patient denies any difficulty with concentration.  Patient denies any suicidal ideation. Lost 18#. Son got married this year.   She has aquarium with assassin snails. Plan: no med changes  05/23/20 appt noted: GS in [place].  Mo of the father.  Born this year. Patient reports stable mood and denies depressed or irritable moods.  Patient denies any recent difficulty with anxiety.  Patient denies difficulty with sleep initiation or maintenance. Denies appetite disturbance.  Patient reports that energy and motivation have been good.  Patient  denies any difficulty with concentration.  Patient denies any suicidal ideation. Overall anxiety is better. Reduced lorazepam to 1 daily during Spring and 2-3 daily in winter. Continued sertraline 100 daily without SE.  Joined [facility].  Failed attempt to decrease Zoloft to 75 mg early 2019 due to weepiness.  Prior psych med trials include Benadryl and melatonin for sleep, buspirone increased depression Lexapro 20, methyl folate, aripiprazole 15 mg, venlafaxine 300 mg, Wellbutrin XL 450 mg, sertraline, Xanax, trazodone,  Review of Systems:  Review of Systems  Cardiovascular: Negative for palpitations.  Neurological: Negative for tremors and weakness.  Psychiatric/Behavioral: Positive for depression. Negative for agitation, behavioral problems, confusion, decreased concentration, dysphoric mood, hallucinations, self-injury, sleep disturbance and suicidal ideas. The patient is not nervous/anxious and is not hyperactive.   Depression is not present.  That note in error.  Medications: I have reviewed the patient's current medications.  Current Outpatient Medications  Medication Sig Dispense Refill  . ibuprofen (ADVIL,MOTRIN) 200 MG tablet Take 800 mg by mouth every 6 (six) hours as needed for moderate pain.     Marland Kitchen LORazepam (ATIVAN) 0.5 MG tablet TAKE 1 TO 2 TABLETS BY MOUTH TWICE DAILY AS NEEDED FOR ANXIETY 180 tablet 0  . sertraline (ZOLOFT) 100 MG tablet Take 1 tablet by mouth once daily 90 tablet 3  . traZODone (DESYREL) 50 MG tablet TAKE 1 TO 2 TABLETS BY MOUTH AT BEDTIME AS NEEDED FOR INSOMNIA 180 tablet 3  . bismuth subsalicylate (PEPTO BISMOL) 262 MG/15ML suspension Take 30 mLs by mouth every 6 (six) hours as needed for indigestion. (Patient not taking: Reported on  05/23/2020)     No current facility-administered medications for this visit.    Medication Side Effects: None  Allergies: No Known Allergies  Past Medical History:  Diagnosis Date  . Anemia   . Anxiety    . Depression   . GERD (gastroesophageal reflux disease)   . Gestational diabetes   . 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 Microscopic hematuria    h/o-negative UA  . Pneumonia     Family History  Problem Relation Age of Onset  . Hypertension Father   . Heart attack Father   . Alzheimer's disease Father   . Diabetes Maternal Grandmother   . Hypertension Mother   . Rectal cancer Mother 74  . Hypertension Brother     Social History   Socioeconomic History  . Marital status: Divorced    Spouse name: Not on file  . Number of children: 2  . Years of education: 2 years college  . Highest education level: Not on file  Occupational History  . Not on file  Tobacco Use  . Smoking status: Current Every Day Smoker    Packs/day: 0.50    Years: 2.00    Pack years: 1.00  . Smokeless tobacco: Never Used  . Tobacco comment: lives with a smoker  Vaping Use  . Vaping Use: Never used  Substance and Sexual Activity  . Alcohol use: Yes    Alcohol/week: 14.0 standard drinks    Types: 14 Standard drinks or equivalent per week    Comment: a week  . Drug use: No  . Sexual activity: Yes    Partners: Male  Other Topics Concern  . Not on file  Social History Narrative  . Not on file   Social Determinants of Health   Financial Resource Strain: Not on file  Food Insecurity: Not on file  Transportation Needs: Not on file  Physical Activity: Not on file  Stress: Not on file  Social Connections: Not on file  Intimate Partner Violence: Not on file    Past Medical History, Surgical history, Social history, and Family history were reviewed and updated as appropriate.   Please see review of systems for further details on the patient's review from today.   Objective:   Physical Exam:  There were no vitals taken for this visit.  Physical Exam Constitutional:      General: She is not in acute distress.    Appearance: She is well-developed.  Musculoskeletal:        General: No deformity.   Neurological:     Mental Status: She is alert and oriented to person, place, and time.     Motor: No tremor.     Coordination: Coordination normal.     Gait: Gait normal.  Psychiatric:        Attention and Perception: Attention and perception normal.        Mood and Affect: Mood is not anxious or depressed. Affect is not labile, blunt, angry or inappropriate.        Speech: Speech normal.        Behavior: Behavior normal. Behavior is not slowed.        Thought Content: Thought content normal. Thought content does not include homicidal or suicidal ideation. Thought content does not include homicidal or suicidal plan.        Cognition and Memory: Cognition normal.        Judgment: Judgment normal.     Comments: Insight intact. No auditory or visual hallucinations. No delusions.  Anxiety is manageable with meds.     Lab Review:     Component Value Date/Time   NA 144 11/08/2017 0425   NA 139 06/23/2016 1004   K 4.7 11/08/2017 0425   CL 108 11/08/2017 0425   CO2 27 11/08/2017 0425   GLUCOSE 170 (H) 11/08/2017 0425   BUN <5 (L) 11/08/2017 0425   BUN 17 06/23/2016 1004   CREATININE 0.53 11/08/2017 0425   CALCIUM 9.0 11/08/2017 0425   PROT 6.2 (L) 11/08/2017 0425   PROT 7.1 06/23/2016 1004   ALBUMIN 3.0 (L) 11/08/2017 0425   ALBUMIN 5.1 06/23/2016 1004   AST 46 (H) 11/08/2017 0425   ALT 41 11/08/2017 0425   ALKPHOS 148 (H) 11/08/2017 0425   BILITOT 1.2 11/08/2017 0425   BILITOT 0.5 06/23/2016 1004   GFRNONAA >60 11/08/2017 0425   GFRAA >60 11/08/2017 0425       Component Value Date/Time   WBC 11.1 (H) 11/08/2017 0425   RBC 4.40 11/08/2017 0425   HGB 13.9 11/08/2017 0425   HGB 15.2 06/23/2016 1004   HGB 14.3 12/02/2013 1048   HCT 43.0 11/08/2017 0425   HCT 45.2 06/23/2016 1004   PLT 245 11/08/2017 0425   PLT 320 06/23/2016 1004   MCV 97.7 11/08/2017 0425   MCV 93 06/23/2016 1004   MCH 31.6 11/08/2017 0425   MCHC 32.3 11/08/2017 0425   RDW 13.8 11/08/2017 0425    RDW 14.1 06/23/2016 1004   LYMPHSABS 2.6 06/23/2016 1004   EOSABS 0.1 06/23/2016 1004   BASOSABS 0.1 06/23/2016 1004    No results found for: POCLITH, LITHIUM   No results found for: PHENYTOIN, PHENOBARB, VALPROATE, CBMZ   .res Assessment: Plan:    April Cook was seen today for follow-up and generalized anxiety disorder.  Diagnoses and all orders for this visit:  Generalized anxiety disorder  Social anxiety disorder  Recurrent major depression in full remission (HCC)  Dysthymia  Insomnia due to mental condition   Recent seasonal depression resolved.  Didn't tolerate sertraline 150. Continue sertraline 100 mg daily. Symptoms are under good control.  No changes indicated.  Trazodone prn helps  We discussed the short-term risks associated with benzodiazepines including sedation and increased fall risk among others.  Discussed long-term side effect risk including dependence, potential withdrawal symptoms, and the potential eventual dose-related risk of dementia.  But recent studies from 2020 dispute this association between benzodiazepines and dementia risk. Newer studies in 2020 do not support an association with dementia. LED lorazepam.  No med changes  FU 12 mos  Meredith Staggers, MD, DFAPA   Please see After Visit Summary for patient specific instructions.  No future appointments.  No orders of the defined types were placed in this encounter.     -------------------------------

## 2020-09-17 ENCOUNTER — Other Ambulatory Visit: Payer: Self-pay | Admitting: Psychiatry

## 2020-09-17 DIAGNOSIS — F411 Generalized anxiety disorder: Secondary | ICD-10-CM

## 2020-10-17 ENCOUNTER — Other Ambulatory Visit: Payer: Self-pay | Admitting: Psychiatry

## 2020-10-17 DIAGNOSIS — F411 Generalized anxiety disorder: Secondary | ICD-10-CM

## 2021-03-04 ENCOUNTER — Other Ambulatory Visit: Payer: Self-pay | Admitting: Psychiatry

## 2021-03-04 DIAGNOSIS — F5105 Insomnia due to other mental disorder: Secondary | ICD-10-CM

## 2021-03-04 DIAGNOSIS — F3342 Major depressive disorder, recurrent, in full remission: Secondary | ICD-10-CM

## 2021-03-04 DIAGNOSIS — F341 Dysthymic disorder: Secondary | ICD-10-CM

## 2021-03-04 DIAGNOSIS — F411 Generalized anxiety disorder: Secondary | ICD-10-CM

## 2021-05-11 ENCOUNTER — Other Ambulatory Visit: Payer: Self-pay | Admitting: Psychiatry

## 2021-05-11 DIAGNOSIS — F341 Dysthymic disorder: Secondary | ICD-10-CM

## 2021-05-11 DIAGNOSIS — F411 Generalized anxiety disorder: Secondary | ICD-10-CM

## 2021-05-11 DIAGNOSIS — F3342 Major depressive disorder, recurrent, in full remission: Secondary | ICD-10-CM

## 2021-05-11 DIAGNOSIS — F5105 Insomnia due to other mental disorder: Secondary | ICD-10-CM

## 2021-05-23 ENCOUNTER — Ambulatory Visit (INDEPENDENT_AMBULATORY_CARE_PROVIDER_SITE_OTHER): Payer: BC Managed Care – PPO | Admitting: Psychiatry

## 2021-05-23 ENCOUNTER — Encounter: Payer: Self-pay | Admitting: Psychiatry

## 2021-05-23 DIAGNOSIS — F3342 Major depressive disorder, recurrent, in full remission: Secondary | ICD-10-CM | POA: Diagnosis not present

## 2021-05-23 DIAGNOSIS — F341 Dysthymic disorder: Secondary | ICD-10-CM | POA: Diagnosis not present

## 2021-05-23 DIAGNOSIS — F411 Generalized anxiety disorder: Secondary | ICD-10-CM

## 2021-05-23 DIAGNOSIS — F5105 Insomnia due to other mental disorder: Secondary | ICD-10-CM

## 2021-05-23 DIAGNOSIS — F4010 Social phobia, unspecified: Secondary | ICD-10-CM | POA: Diagnosis not present

## 2021-05-23 MED ORDER — LORAZEPAM 0.5 MG PO TABS: ORAL_TABLET | ORAL | 1 refills | Status: DC

## 2021-05-23 MED ORDER — TRAZODONE HCL 50 MG PO TABS: ORAL_TABLET | ORAL | 3 refills | Status: DC

## 2021-05-23 MED ORDER — SERTRALINE HCL 100 MG PO TABS: 100.0000 mg | ORAL_TABLET | Freq: Every day | ORAL | 3 refills | Status: DC

## 2021-05-23 NOTE — Progress Notes (Signed)
April Cook ?[id number] ?1960/03/27 ?61 y.o. ? ?Subjective:  ? ?Patient ID:  April Cook is a 61 y.o. (DOB Feb 23, 1960) female. ? ?Chief Complaint:  ?Chief Complaint  ?Patient presents with  ? Follow-up  ? ? ?Anxiety ?Patient reports no confusion, decreased concentration, nervous/anxious behavior, palpitations or suicidal ideas.  ? ? ?Depression ?       Associated symptoms include no decreased concentration and no suicidal ideas.  Past medical history includes anxiety.   ?April Cook presents to the office today for follow-up of depression and anxiety.   ? ?seen July 2020.  No meds were changed ? ?05/2019 appt noted: ?More depressed over holidays and increase sertraline to 150 mg for 4 weeks without help and felt more agitated and restless, jittery. Depression is better now.  Outside with planting.  Still upset over H leaving her for another woman. ?History seasonal depression. ?Pretty good.  At first with Covid got more anxious but better now.  Using less Ativan overall...average now 1 daily.  Patient reports stable mood and denies depressed or irritable moods.  Patient denies any recent difficulty with anxiety except a little in the morning. ?Only take lorazepam 0.5 mg 3-4 AM/week..  Patient denies difficulty with sleep initiation or maintenance. Denies appetite disturbance.  Patient reports that energy and motivation have been good.  Patient denies any difficulty with concentration.  Patient denies any suicidal ideation. ?Lost 18#. ?Son got married this year.   ?She has aquarium with assassin snails. ?Plan: no med changes ? ?05/23/20 appt noted: ?GS in [place].  Mo of the father.  Born this year. ?Patient reports stable mood and denies depressed or irritable moods.  Patient denies any recent difficulty with anxiety.  Patient denies difficulty with sleep initiation or maintenance. Denies appetite disturbance.  Patient reports that energy and motivation have been good.  Patient denies any difficulty with  concentration.  Patient denies any suicidal ideation. ?Overall anxiety is better. Reduced lorazepam to 1 daily during Spring and 2-3 daily in winter. ?Continued sertraline 100 daily without SE.  ?Joined [employer]. ? ?05/23/21 appt noted: ?Good.  No depressive episodes.  Patient reports stable mood and denies depressed or irritable moods.  Patient denies difficulty with sleep initiation or maintenance. Denies appetite disturbance.  Patient reports that energy and motivation have been good.  Patient denies any difficulty with concentration.  Patient denies any suicidal ideation. ?Anxiety better with time. ?May have EMA without trazodone. ?Ativan 2 daily for anxiety ? ? ?Failed attempt to decrease Zoloft to 75 mg early 2019 due to weepiness. ? ?Prior psych med trials include Benadryl and melatonin for sleep, buspirone increased depression Lexapro 20, methyl folate, aripiprazole 15 mg, venlafaxine 300 mg, Wellbutrin XL 450 mg, sertraline, Xanax, trazodone, ? ?Review of Systems:  ?Review of Systems  ?Cardiovascular:  Negative for palpitations.  ?Neurological:  Negative for tremors and weakness.  ?Psychiatric/Behavioral:  Positive for sleep disturbance. Negative for agitation, behavioral problems, confusion, decreased concentration, dysphoric mood, hallucinations, self-injury and suicidal ideas. The patient is not nervous/anxious and is not hyperactive.  Depression is not present.  That note in error. ? ?Medications: I have reviewed the patient's current medications. ? ?Current Outpatient Medications  ?Medication Sig Dispense Refill  ? bismuth subsalicylate (PEPTO BISMOL) 262 MG/15ML suspension Take 30 mLs by mouth every 6 (six) hours as needed for indigestion.    ? ibuprofen (ADVIL,MOTRIN) 200 MG tablet Take 800 mg by mouth every 6 (six) hours as needed for moderate pain.     ?  LORazepam (ATIVAN) 0.5 MG tablet TAKE ONE TO TWO TABLETS BY MOUTH THREE TIMES DAILY AS NEEDED FOR ANXIETY 180 tablet 1  ? sertraline  (ZOLOFT) 100 MG tablet Take 1 tablet (100 mg total) by mouth daily. 90 tablet 3  ? traZODone (DESYREL) 50 MG tablet TAKE 1 TO 2 TABLETS BY MOUTH AT BEDTIME AS NEEDED FOR INSOMNIA 180 tablet 3  ? ?No current facility-administered medications for this visit.  ? ? ?Medication Side Effects: None ? ?Allergies: No Known Allergies ? ?Past Medical History:  ?Diagnosis Date  ? Anemia   ? Anxiety   ? Depression   ? GERD (gastroesophageal reflux disease)   ? Gestational diabetes   ? 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 Microscopic hematuria   ? h/o-negative UA  ? Pneumonia   ? ? ?Family History  ?Problem Relation Age of Onset  ? Hypertension Father   ? Heart attack Father   ? Alzheimer's disease Father   ? Diabetes Maternal Grandmother   ? Hypertension Mother   ? Rectal cancer Mother [id number]  ? Hypertension Brother   ? ? ?Social History  ? ?Socioeconomic History  ? Marital status: Divorced  ?  Spouse name: Not on file  ? Number of children: 2  ? Years of education: 2 years college  ? Highest education level: Not on file  ?Occupational History  ? Not on file  ?Tobacco Use  ? Smoking status: Every Day  ?  Packs/day: 0.50  ?  Years: 2.00  ?  Pack years: 1.00  ?  Types: Cigarettes  ? Smokeless tobacco: Never  ? Tobacco comments:  ?  lives with a smoker  ?Vaping Use  ? Vaping Use: Never used  ?Substance and Sexual Activity  ? Alcohol use: Yes  ?  Alcohol/week: 14.0 standard drinks  ?  Types: 14 Standard drinks or equivalent per week  ?  Comment: a week  ? Drug use: No  ? Sexual activity: Yes  ?  Partners: Male  ?Other Topics Concern  ? Not on file  ?Social History Narrative  ? Not on file  ? ?Social Determinants of Health  ? ?Financial Resource Strain: Not on file  ?Food Insecurity: Not on file  ?Transportation Needs: Not on file  ?Physical Activity: Not on file  ?Stress: Not on file  ?Social Connections: Not on file  ?Intimate Partner Violence: Not on file  ? ? ?Past Medical History, Surgical history, Social history, and Family history were  reviewed and updated as appropriate.  ? ?Please see review of systems for further details on the patient's review from today.  ? ?Objective:  ? ?Physical Exam:  ?There were no vitals taken for this visit. ? ?Physical Exam ?Constitutional:   ?   General: She is not in acute distress. ?   Appearance: She is well-developed.  ?Musculoskeletal:     ?   General: No deformity.  ?Neurological:  ?   Mental Status: She is alert and oriented to person, place, and time.  ?   Motor: No tremor.  ?   Coordination: Coordination normal.  ?   Gait: Gait normal.  ?Psychiatric:     ?   Attention and Perception: Attention and perception normal.     ?   Mood and Affect: Mood is not anxious or depressed. Affect is not labile, blunt, angry or inappropriate.     ?   Speech: Speech normal.     ?   Behavior: Behavior normal. Behavior is not slowed.     ?  Thought Content: Thought content is not delusional. Thought content does not include homicidal or suicidal ideation. Thought content does not include suicidal plan.     ?   Cognition and Memory: Cognition normal.     ?   Judgment: Judgment normal.  ?   Comments: Insight intact. ?No auditory or visual hallucinations. No delusions.  ?Anxiety is manageable with meds.  ? ? ?Lab Review:  ?   ?Component Value Date/Time  ? NA 144 11/08/2017 0425  ? NA 139 06/23/2016 1004  ? K 4.7 11/08/2017 0425  ? CL 108 11/08/2017 0425  ? CO2 27 11/08/2017 0425  ? GLUCOSE 170 (H) 11/08/2017 0425  ? BUN <5 (L) 11/08/2017 0425  ? BUN 17 06/23/2016 1004  ? CREATININE 0.53 11/08/2017 0425  ? CALCIUM 9.0 11/08/2017 0425  ? PROT 6.2 (L) 11/08/2017 0425  ? PROT 7.1 06/23/2016 1004  ? ALBUMIN 3.0 (L) 11/08/2017 0425  ? ALBUMIN 5.1 06/23/2016 1004  ? AST 46 (H) 11/08/2017 0425  ? ALT 41 11/08/2017 0425  ? ALKPHOS 148 (H) 11/08/2017 0425  ? BILITOT 1.2 11/08/2017 0425  ? BILITOT 0.5 06/23/2016 1004  ? GFRNONAA >60 11/08/2017 0425  ? GFRAA >60 11/08/2017 0425  ? ? ?   ?Component Value Date/Time  ? WBC 11.1 (H) 11/08/2017  0425  ? RBC 4.40 11/08/2017 0425  ? HGB 13.9 11/08/2017 0425  ? HGB 15.2 06/23/2016 1004  ? HGB 14.3 12/02/2013 1048  ? HCT 43.0 11/08/2017 0425  ? HCT 45.2 06/23/2016 1004  ? PLT 245 11/08/2017 0425  ? PLT 320 06/23/2016

## 2021-10-21 ENCOUNTER — Other Ambulatory Visit: Payer: Self-pay | Admitting: Psychiatry

## 2021-10-21 DIAGNOSIS — F411 Generalized anxiety disorder: Secondary | ICD-10-CM

## 2022-04-15 ENCOUNTER — Other Ambulatory Visit: Payer: Self-pay | Admitting: Psychiatry

## 2022-04-15 DIAGNOSIS — F411 Generalized anxiety disorder: Secondary | ICD-10-CM

## 2022-05-27 ENCOUNTER — Ambulatory Visit (INDEPENDENT_AMBULATORY_CARE_PROVIDER_SITE_OTHER): Payer: BC Managed Care – PPO | Admitting: Psychiatry

## 2022-05-27 ENCOUNTER — Encounter: Payer: Self-pay | Admitting: Psychiatry

## 2022-05-27 DIAGNOSIS — F341 Dysthymic disorder: Secondary | ICD-10-CM

## 2022-05-27 DIAGNOSIS — F411 Generalized anxiety disorder: Secondary | ICD-10-CM

## 2022-05-27 DIAGNOSIS — F3342 Major depressive disorder, recurrent, in full remission: Secondary | ICD-10-CM | POA: Diagnosis not present

## 2022-05-27 DIAGNOSIS — F4010 Social phobia, unspecified: Secondary | ICD-10-CM

## 2022-05-27 DIAGNOSIS — F5105 Insomnia due to other mental disorder: Secondary | ICD-10-CM

## 2022-05-27 MED ORDER — SERTRALINE HCL 100 MG PO TABS: 100.0000 mg | ORAL_TABLET | Freq: Every day | ORAL | 3 refills | Status: DC

## 2022-05-27 MED ORDER — LORAZEPAM 0.5 MG PO TABS: ORAL_TABLET | ORAL | 0 refills | Status: DC

## 2022-05-27 MED ORDER — TRAZODONE HCL 50 MG PO TABS: ORAL_TABLET | ORAL | 3 refills | Status: DC

## 2022-05-27 NOTE — Progress Notes (Signed)
April Cook [id number] Apr 25, 1960 62 y.o.  Subjective:   Patient ID:  April Cook is a 62 y.o. (DOB January 07, 1961) female.  Chief Complaint:  Chief Complaint  Patient presents with   Follow-up    Anxiety Patient reports no confusion, decreased concentration, nervous/anxious behavior, palpitations or suicidal ideas.    Depression        Associated symptoms include no decreased concentration and no suicidal ideas.  Past medical history includes anxiety.    April Cook presents to the office today for follow-up of depression and anxiety.    seen July 2020.  No meds were changed  05/2019 appt noted: More depressed over holidays and increase sertraline to 150 mg for 4 weeks without help and felt more agitated and restless, jittery. Depression is better now.  Outside with planting.  Still upset over H leaving her for another woman. History seasonal depression. Pretty good.  At first with Covid got more anxious but better now.  Using less Ativan overall...average now 1 daily.  Patient reports stable mood and denies depressed or irritable moods.  Patient denies any recent difficulty with anxiety except a little in the morning. Only take lorazepam 0.5 mg 3-4 AM/week..  Patient denies difficulty with sleep initiation or maintenance. Denies appetite disturbance.  Patient reports that energy and motivation have been good.  Patient denies any difficulty with concentration.  Patient denies any suicidal ideation. Lost 18#. Son got married this year.   She has aquarium with assassin snails. Plan: no med changes  05/23/20 appt noted: GS in [place].  Mo of the father.  Born this year. Patient reports stable mood and denies depressed or irritable moods.  Patient denies any recent difficulty with anxiety.  Patient denies difficulty with sleep initiation or maintenance. Denies appetite disturbance.  Patient reports that energy and motivation have been good.  Patient denies any difficulty with  concentration.  Patient denies any suicidal ideation. Overall anxiety is better. Reduced lorazepam to 1 daily during Spring and 2-3 daily in winter. Continued sertraline 100 daily without SE.  Joined [facility] [employer].  05/23/21 appt noted: Good.  No depressive episodes.  Patient reports stable mood and denies depressed or irritable moods.  Patient denies difficulty with sleep initiation or maintenance. Denies appetite disturbance.  Patient reports that energy and motivation have been good.  Patient denies any difficulty with concentration.  Patient denies any suicidal ideation. Anxiety better with time. May have EMA without trazodone. Ativan 2 daily for anxiety  05/27/22 appt: Meds: trazodone 50-100 mg HS, sertraline 100, lorazepam 0.5 mg BID prn anxiety.  Takes one as soon as gets up. Been really.  GS born [birth year] and [birth year] and 1/62 yo B.  Cool to watch her son be a dad.   Not depressed .  Anxiety pretty good.  Took a year to sue exH with back alimony.  Passed now.   Patient reports stable mood and denies depressed or irritable moods.  Patient denies any recent difficulty with anxiety.  Patient denies difficulty with sleep initiation or maintenance. Denies appetite disturbance.  Patient reports that energy and motivation have been good.  Patient denies any difficulty with concentration.  Patient denies any suicidal ideation. Sleep benefit with trazodone.   Failed attempt to decrease Zoloft to 75 mg early 2019 due to weepiness.  Prior psych med trials include  buspirone increased depression  Lexapro 20, venlafaxine 300 mg, Wellbutrin XL 450 mg, sertraline,  methyl folate,  aripiprazole 15 mg,  Xanax, trazodone, Benadryl and  melatonin for sleep,  Review of Systems:  Review of Systems  Cardiovascular:  Negative for palpitations.  Neurological:  Negative for tremors.  Psychiatric/Behavioral:  Positive for sleep disturbance. Negative for agitation, behavioral problems, confusion, decreased  concentration, dysphoric mood, hallucinations, self-injury and suicidal ideas. The patient is not nervous/anxious and is not hyperactive.   Depression is not present.    Medications: I have reviewed the patient's current medications.  Current Outpatient Medications  Medication Sig Dispense Refill   bismuth subsalicylate (PEPTO BISMOL) 262 MG/15ML suspension Take 30 mLs by mouth every 6 (six) hours as needed for indigestion.     ibuprofen (ADVIL,MOTRIN) 200 MG tablet Take 800 mg by mouth every 6 (six) hours as needed for moderate pain.      LORazepam (ATIVAN) 0.5 MG tablet TAKE 1 TO 2 TABLETS BY MOUTH THREE TIMES DAILY AS NEEDED FOR ANXIETY 180 tablet 0   sertraline (ZOLOFT) 100 MG tablet Take 1 tablet (100 mg total) by mouth daily. 90 tablet 3   traZODone (DESYREL) 50 MG tablet TAKE 1 TO 2 TABLETS BY MOUTH AT BEDTIME AS NEEDED FOR INSOMNIA 180 tablet 3   No current facility-administered medications for this visit.    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   Microscopic hematuria    h/o-negative UA   Pneumonia     Family History  Problem Relation Age of Onset   Hypertension Father    Heart attack Father    Alzheimer's disease Father    Diabetes Maternal Grandmother    Hypertension Mother    Rectal cancer Mother 8   Hypertension Brother     Social History   Socioeconomic History   Marital status: Divorced    Spouse name: Not on file   Number of children: 2   Years of education: 2 years college   Highest education level: Not on file  Occupational History   Not on file  Tobacco Use   Smoking status: Every Day    Packs/day: 0.50    Years: 2.00    Additional pack years: 0.00    Total pack years: 1.00    Types: Cigarettes   Smokeless tobacco: Never   Tobacco comments:    lives  with a smoker  Vaping Use   Vaping Use: Never used  Substance and Sexual Activity   Alcohol use: Yes    Alcohol/week: 14.0 standard drinks of alcohol    Types: 14 Standard drinks or equivalent per week    Comment: a week   Drug use: No   Sexual activity: Yes    Partners: Male  Other Topics Concern   Not on file  Social History Narrative   Not on file   Social Determinants of Health   Financial Resource Strain: Low Risk  (11/06/2017)   Overall Financial Resource Strain (CARDIA)    Difficulty of Paying Living Expenses: Not hard at all  Food Insecurity: No Food Insecurity (11/06/2017)   Hunger Vital Sign    Worried About Running Out of Food in the Last Year: Never true    Ran Out of Food in the Last Year: Never true  Transportation Needs: No Transportation Needs (11/06/2017)   PRAPARE - Administrator, Civil Service (Medical): No    Lack of Transportation (  Non-Medical): No  Physical Activity: Inactive (11/06/2017)   Exercise Vital Sign    Days of Exercise per Week: 0 days    Minutes of Exercise per Session: 0 min  Stress: No Stress Concern Present (11/06/2017)   [employer] of Occupational Health - Occupational Stress Questionnaire    Feeling of Stress : Only a little  Social Connections: Not on file  Intimate Partner Violence: Not on file    Past Medical History, Surgical history, Social history, and Family history were reviewed and updated as appropriate.   Please see review of systems for further details on the patient's review from today.   Objective:   Physical Exam:  There were no vitals taken for this visit.  Physical Exam Constitutional:      General: She is not in acute distress.    Appearance: She is well-developed.  Musculoskeletal:        General: No deformity.  Neurological:     Mental Status: She is alert and oriented to person, place, and time.     Motor: No tremor.     Coordination: Coordination normal.     Gait: Gait normal.   Psychiatric:        Attention and Perception: Attention and perception normal.        Mood and Affect: Mood is not anxious or depressed. Affect is not labile, blunt, angry or inappropriate.        Speech: Speech normal.        Behavior: Behavior normal. Behavior is not slowed.        Thought Content: Thought content is not delusional. Thought content does not include homicidal or suicidal ideation. Thought content does not include suicidal plan.        Cognition and Memory: Cognition normal.        Judgment: Judgment normal.     Comments: Insight intact. No auditory or visual hallucinations. No delusions.  Anxiety is manageable with meds.     Lab Review:     Component Value Date/Time   NA 144 11/08/2017 0425   NA 139 06/23/2016 1004   K 4.7 11/08/2017 0425   CL 108 11/08/2017 0425   CO2 27 11/08/2017 0425   GLUCOSE 170 (H) 11/08/2017 0425   BUN <5 (L) 11/08/2017 0425   BUN 17 06/23/2016 1004   CREATININE 0.53 11/08/2017 0425   CALCIUM 9.0 11/08/2017 0425   PROT 6.2 (L) 11/08/2017 0425   PROT 7.1 06/23/2016 1004   ALBUMIN 3.0 (L) 11/08/2017 0425   ALBUMIN 5.1 06/23/2016 1004   AST 46 (H) 11/08/2017 0425   ALT 41 11/08/2017 0425   ALKPHOS 148 (H) 11/08/2017 0425   BILITOT 1.2 11/08/2017 0425   BILITOT 0.5 06/23/2016 1004   GFRNONAA >60 11/08/2017 0425   GFRAA >60 11/08/2017 0425       Component Value Date/Time   WBC 11.1 (H) 11/08/2017 0425   RBC 4.40 11/08/2017 0425   HGB 13.9 11/08/2017 0425   HGB 15.2 06/23/2016 1004   HGB 14.3 12/02/2013 1048   HCT 43.0 11/08/2017 0425   HCT 45.2 06/23/2016 1004   PLT 245 11/08/2017 0425   PLT 320 06/23/2016 1004   MCV 97.7 11/08/2017 0425   MCV 93 06/23/2016 1004   MCH 31.6 11/08/2017 0425   MCHC 32.3 11/08/2017 0425   RDW 13.8 11/08/2017 0425   RDW 14.1 06/23/2016 1004   LYMPHSABS 2.6 06/23/2016 1004   EOSABS 0.1 06/23/2016 1004   BASOSABS 0.1 06/23/2016 1004  No results found for: "POCLITH", "LITHIUM"   No  results found for: "PHENYTOIN", "PHENOBARB", "VALPROATE", "CBMZ"   .res Assessment: Plan:    April Cook was seen today for follow-up.  Diagnoses and all orders for this visit:  Generalized anxiety disorder -     LORazepam (ATIVAN) 0.5 MG tablet; TAKE 1 TO 2 TABLETS BY MOUTH THREE TIMES DAILY AS NEEDED FOR ANXIETY -     sertraline (ZOLOFT) 100 MG tablet; Take 1 tablet (100 mg total) by mouth daily.  Social anxiety disorder  Recurrent major depression in full remission -     sertraline (ZOLOFT) 100 MG tablet; Take 1 tablet (100 mg total) by mouth daily.  Dysthymia -     sertraline (ZOLOFT) 100 MG tablet; Take 1 tablet (100 mg total) by mouth daily.  Insomnia due to mental condition -     traZODone (DESYREL) 50 MG tablet; TAKE 1 TO 2 TABLETS BY MOUTH AT BEDTIME AS NEEDED FOR INSOMNIA   HX seasonal depression resolved.  Didn't tolerate sertraline 150. Continue sertraline 100 mg daily. Symptoms are under good control.  No changes indicated.  Trazodone prn helps.  Use it more regularly.  No SE and it works  We discussed the short-term risks associated with benzodiazepines including sedation and increased fall risk among others.  Discussed long-term side effect risk including dependence, potential withdrawal symptoms, and the potential eventual dose-related risk of dementia.  But recent studies from 2020 dispute this association between benzodiazepines and dementia risk. Newer studies in 2020 do not support an association with dementia. LED lorazepam.  No med changes desired or indicated. Continue: Meds: trazodone 50-100 mg HS, sertraline 100, lorazepam 0.5 mg BID prn anxiety.  Takes one as soon as gets up.  FU 12 mos  Meredith Staggers, MD, DFAPA   Please see After Visit Summary for patient specific instructions.  No future appointments.  No orders of the defined types were placed in this encounter.     -------------------------------

## 2022-09-22 ENCOUNTER — Other Ambulatory Visit: Payer: Self-pay | Admitting: Psychiatry

## 2022-09-22 DIAGNOSIS — F411 Generalized anxiety disorder: Secondary | ICD-10-CM

## 2023-04-16 ENCOUNTER — Other Ambulatory Visit: Payer: Self-pay | Admitting: Psychiatry

## 2023-04-16 DIAGNOSIS — F411 Generalized anxiety disorder: Secondary | ICD-10-CM

## 2023-05-27 ENCOUNTER — Ambulatory Visit: Payer: BC Managed Care – PPO | Admitting: Psychiatry

## 2023-06-02 ENCOUNTER — Encounter: Payer: Self-pay | Admitting: Psychiatry

## 2023-06-02 ENCOUNTER — Ambulatory Visit (INDEPENDENT_AMBULATORY_CARE_PROVIDER_SITE_OTHER): Payer: BC Managed Care – PPO | Admitting: Psychiatry

## 2023-06-02 DIAGNOSIS — F341 Dysthymic disorder: Secondary | ICD-10-CM

## 2023-06-02 DIAGNOSIS — F5105 Insomnia due to other mental disorder: Secondary | ICD-10-CM

## 2023-06-02 DIAGNOSIS — F411 Generalized anxiety disorder: Secondary | ICD-10-CM | POA: Diagnosis not present

## 2023-06-02 DIAGNOSIS — F3342 Major depressive disorder, recurrent, in full remission: Secondary | ICD-10-CM

## 2023-06-02 DIAGNOSIS — F4010 Social phobia, unspecified: Secondary | ICD-10-CM | POA: Diagnosis not present

## 2023-06-02 MED ORDER — LORAZEPAM 0.5 MG PO TABS: ORAL_TABLET | ORAL | 0 refills | Status: DC

## 2023-06-02 MED ORDER — TRAZODONE HCL 50 MG PO TABS: ORAL_TABLET | ORAL | 3 refills | Status: AC

## 2023-06-02 MED ORDER — SERTRALINE HCL 100 MG PO TABS
100.0000 mg | ORAL_TABLET | Freq: Every day | ORAL | 3 refills | Status: AC
Start: 2023-06-02 — End: ?

## 2023-06-02 NOTE — Progress Notes (Signed)
 April Cook [id number] 01-Feb-1961 63 y.o.  Subjective:   Patient ID:  April Cook is a 62 y.o. (DOB 07-20-1960) female.  Chief Complaint:  Chief Complaint  Patient presents with   Follow-up    April Cook presents to the office today for follow-up of depression and anxiety.    seen July 2020.  No meds were changed  05/2019 appt noted: More depressed over holidays and increase sertraline  to 150 mg for 4 weeks without help and felt more agitated and restless, jittery. Depression is better now.  Outside with planting.  Still upset over H leaving her for another woman. History seasonal depression. Pretty good.  At first with Covid got more anxious but better now.  Using less Ativan  overall...average now 1 daily.  Patient reports stable mood and denies depressed or irritable moods.  Patient denies any recent difficulty with anxiety except a little in the morning. Only take lorazepam  0.5 mg 3-4 AM/week..  Patient denies difficulty with sleep initiation or maintenance. Denies appetite disturbance.  Patient reports that energy and motivation have been good.  Patient denies any difficulty with concentration.  Patient denies any suicidal ideation. Lost 18#. Son got married this year.   She has aquarium with assassin snails. Plan: no med changes  05/23/20 appt noted: April Cook in [place].  Mo of the father.  Born this year. Patient reports stable mood and denies depressed or irritable moods.  Patient denies any recent difficulty with anxiety.  Patient denies difficulty with sleep initiation or maintenance. Denies appetite disturbance.  Patient reports that energy and motivation have been good.  Patient denies any difficulty with concentration.  Patient denies any suicidal ideation. Overall anxiety is better. Reduced lorazepam  to 1 daily during Spring and 2-3 daily in winter. Continued sertraline  100 daily without SE.  Joined [facility] [employer].  05/23/21 appt noted: Good.  No depressive episodes.   Patient reports stable mood and denies depressed or irritable moods.  Patient denies difficulty with sleep initiation or maintenance. Denies appetite disturbance.  Patient reports that energy and motivation have been good.  Patient denies any difficulty with concentration.  Patient denies any suicidal ideation. Anxiety better with time. May have EMA without trazodone . Ativan  2 daily for anxiety  05/27/22 appt: Meds: trazodone  50-100 mg HS, sertraline  100, lorazepam  0.5 mg BID prn anxiety.  Takes one as soon as gets up. Been really.  April Cook born [birth year] and [birth year] and 1/63 yo April Cook.  Cool to watch her son be a dad.   Not depressed .  Anxiety pretty good.  Took a year to sue April Cook with back alimony.  Passed now.   Patient reports stable mood and denies depressed or irritable moods.  Patient denies any recent difficulty with anxiety.  Patient denies difficulty with sleep initiation or maintenance. Denies appetite disturbance.  Patient reports that energy and motivation have been good.  Patient denies any difficulty with concentration.  Patient denies any suicidal ideation. Sleep benefit with trazodone .  06/02/23 appt : Med as above .  Less and less lorazepam  all the time.   [facility].  Don't have to take lorazepam  before it now.  Sleep pretty well trazodone .  No SE.  No dep generally.  But a couple situational dips with family death from drugs.  His D is like a D to her.   Weekly see April Cook. Sleep ok with trazodone .    Failed attempt to decrease Zoloft  to 75 mg early 2019 due to weepiness.  Prior psych med  trials include  buspirone increased depression  Lexapro 20, venlafaxine 300 mg, sertraline , Wellbutrin XL 450 mg,   methyl folate,  aripiprazole 15 mg,  Xanax, trazodone , Benadryl and melatonin for sleep,  Review of Systems:  Review of Systems  Cardiovascular:  Negative for palpitations.  Neurological:  Negative for tremors.  Psychiatric/Behavioral:  Positive for depression. Negative for  agitation, behavioral problems, confusion, decreased concentration, dysphoric mood, hallucinations, self-injury, sleep disturbance and suicidal ideas. The patient is not nervous/anxious and is not hyperactive.   Depression is not present.    Medications: I have reviewed the patient's current medications.  Current Outpatient Medications  Medication Sig Dispense Refill   bismuth subsalicylate (PEPTO BISMOL) 262 MG/15ML suspension Take 30 mLs by mouth every 6 (six) hours as needed for indigestion.     ibuprofen (ADVIL,MOTRIN) 200 MG tablet Take 800 mg by mouth every 6 (six) hours as needed for moderate pain.      LORazepam  (ATIVAN ) 0.5 MG tablet TAKE 1 TO 2 TABLETS BY MOUTH THREE TIMES DAILY AS NEEDED FOR ANXIETY 90 tablet 0   sertraline  (ZOLOFT ) 100 MG tablet Take 1 tablet (100 mg total) by mouth daily. 90 tablet 3   traZODone  (DESYREL ) 50 MG tablet TAKE 1 TO 2 TABLETS BY MOUTH AT BEDTIME AS NEEDED FOR INSOMNIA 180 tablet 3   No current facility-administered medications for this visit.    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   Microscopic hematuria    h/o-negative UA   Pneumonia     Family History  Problem Relation Age of Onset   Hypertension Father    Heart attack Father    Alzheimer's disease Father    Diabetes Maternal Grandmother    Hypertension Mother    Rectal cancer Mother 35   Hypertension Brother     Social History   Socioeconomic History   Marital status: Divorced    Spouse name: Not on file   Number of children: 2   Years of education: 2 years college   Highest education level: Not on file  Occupational History   Not on file  Tobacco Use   Smoking status: Every Day    Current packs/day: 0.50    Average packs/day: 0.5 packs/day for 2.0 years (1.0 ttl pk-yrs)    Types:  Cigarettes   Smokeless tobacco: Never   Tobacco comments:    lives with a smoker  Vaping Use   Vaping status: Never Used  Substance and Sexual Activity   Alcohol use: Yes    Alcohol/week: 14.0 standard drinks of alcohol    Types: 14 Standard drinks or equivalent per week    Comment: a week   Drug use: No   Sexual activity: Yes    Partners: Male  Other Topics Concern   Not on file  Social History Narrative   Not on file   Social Drivers of Health   Financial Resource Strain: Low Risk  (11/06/2017)   Overall Financial Resource Strain (CARDIA)    Difficulty of Paying Living Expenses: Not hard at all  Food Insecurity: No Food Insecurity (11/06/2017)   Hunger Vital Sign    Worried About Running Out of Food in the Last Year: Never true    Ran Out of Food in the Last Year: Never true  Transportation Needs: No Transportation Needs (11/06/2017)   PRAPARE - Administrator, Civil Service (Medical): No    Lack of Transportation (Non-Medical): No  Physical Activity: Inactive (11/06/2017)   Exercise Vital Sign    Days of Exercise per Week: 0 days    Minutes of Exercise per Session: 0 min  Stress: No Stress Concern Present (11/06/2017)   [employer] of Occupational Health - Occupational Stress Questionnaire    Feeling of Stress : Only a little  Social Connections: Not on file  Intimate Partner Violence: Not on file    Past Medical History, Surgical history, Social history, and Family history were reviewed and updated as appropriate.   Please see review of systems for further details on the patient's review from today.   Objective:   Physical Exam:  There were no vitals taken for this visit.  Physical Exam Constitutional:      General: She is not in acute distress.    Appearance: She is well-developed.  Musculoskeletal:        General: No deformity.  Neurological:     Mental Status: She is alert and oriented to person, place, and time.     Motor: No tremor.      Coordination: Coordination normal.     Gait: Gait normal.  Psychiatric:        Attention and Perception: Attention and perception normal. She does not perceive auditory hallucinations.        Mood and Affect: Mood is not anxious or depressed. Affect is not labile, blunt, angry, tearful or inappropriate.        Speech: Speech normal.        Behavior: Behavior normal. Behavior is not slowed.        Thought Content: Thought content is not delusional. Thought content does not include homicidal or suicidal ideation. Thought content does not include suicidal plan.        Cognition and Memory: Cognition normal.        Judgment: Judgment normal.     Comments: Insight intact. No auditory or visual hallucinations. No delusions.  Anxiety is manageable with meds.     Lab Review:     Component Value Date/Time   NA 144 11/08/2017 0425   NA 139 06/23/2016 1004   K 4.7 11/08/2017 0425   CL 108 11/08/2017 0425   CO2 27 11/08/2017 0425   GLUCOSE 170 (H) 11/08/2017 0425   BUN <5 (L) 11/08/2017 0425   BUN 17 06/23/2016 1004   CREATININE 0.53 11/08/2017 0425   CALCIUM 9.0 11/08/2017 0425   PROT 6.2 (L) 11/08/2017 0425   PROT 7.1 06/23/2016 1004   ALBUMIN 3.0 (L) 11/08/2017 0425   ALBUMIN 5.1 06/23/2016 1004   AST 46 (H) 11/08/2017 0425   ALT 41 11/08/2017 0425   ALKPHOS 148 (H) 11/08/2017 0425   BILITOT 1.2 11/08/2017 0425   BILITOT 0.5 06/23/2016 1004   GFRNONAA >60 11/08/2017 0425   GFRAA >60 11/08/2017 0425       Component Value Date/Time   WBC 11.1 (H) 11/08/2017 0425   RBC 4.40 11/08/2017 0425   HGB 13.9 11/08/2017 0425   HGB 15.2 06/23/2016 1004   HGB 14.3 12/02/2013 1048   HCT 43.0 11/08/2017 0425   HCT 45.2 06/23/2016 1004   PLT 245 11/08/2017 0425   PLT 320 06/23/2016 1004   MCV 97.7 11/08/2017 0425   MCV 93 06/23/2016 1004   MCH 31.6 11/08/2017 0425   MCHC 32.3 11/08/2017 0425  RDW 13.8 11/08/2017 0425   RDW 14.1 06/23/2016 1004   LYMPHSABS 2.6 06/23/2016 1004    EOSABS 0.1 06/23/2016 1004   BASOSABS 0.1 06/23/2016 1004    No results found for: "POCLITH", "LITHIUM"   No results found for: "PHENYTOIN", "PHENOBARB", "VALPROATE", "CBMZ"   .res Assessment: Plan:    Jenness Jablonowski" was seen today for follow-up.  Diagnoses and all orders for this visit:  Generalized anxiety disorder -     sertraline  (ZOLOFT ) 100 MG tablet; Take 1 tablet (100 mg total) by mouth daily. -     LORazepam  (ATIVAN ) 0.5 MG tablet; TAKE 1 TO 2 TABLETS BY MOUTH THREE TIMES DAILY AS NEEDED FOR ANXIETY  Social anxiety disorder  Recurrent major depression in full remission (HCC) -     sertraline  (ZOLOFT ) 100 MG tablet; Take 1 tablet (100 mg total) by mouth daily.  Dysthymia -     sertraline  (ZOLOFT ) 100 MG tablet; Take 1 tablet (100 mg total) by mouth daily.  Insomnia due to mental condition -     traZODone  (DESYREL ) 50 MG tablet; TAKE 1 TO 2 TABLETS BY MOUTH AT BEDTIME AS NEEDED FOR INSOMNIA    HX seasonal depression resolved.  Didn't tolerate sertraline  150. Continue sertraline  100 mg daily. Symptoms are under good control.  No changes indicated.  Trazodone  prn helps.  Use it more regularly.  No SE and it works  We discussed the short-term risks associated with benzodiazepines including sedation and increased fall risk among others.  Discussed long-term side effect risk including dependence, potential withdrawal symptoms, and the potential eventual dose-related risk of dementia.  But recent studies from 2020 dispute this association between benzodiazepines and dementia risk. Newer studies in 2020 do not support an association with dementia. LED lorazepam .  No med changes desired or indicated. Continue: Meds: trazodone  50-100 mg HS, sertraline  100, lorazepam  0.5 mg BID prn anxiety.  Takes one as soon as gets up.  FU 12 mos  Nori Beat, MD, DFAPA   Please see After Visit Summary for patient specific instructions.  No future appointments.  No orders of the  defined types were placed in this encounter.     -------------------------------

## 2023-11-02 ENCOUNTER — Other Ambulatory Visit: Payer: Self-pay | Admitting: Psychiatry

## 2023-11-02 DIAGNOSIS — F411 Generalized anxiety disorder: Secondary | ICD-10-CM

## 2023-11-04 ENCOUNTER — Other Ambulatory Visit: Payer: Self-pay | Admitting: Psychiatry

## 2023-11-04 DIAGNOSIS — F411 Generalized anxiety disorder: Secondary | ICD-10-CM

## 2023-11-06 ENCOUNTER — Telehealth: Payer: Self-pay

## 2023-11-06 NOTE — Telephone Encounter (Signed)
 Received a PA request from pharmacy for Lorazepam  0.5 mg with [employer], response received was pt has alternative pharmacy benefits.  They advise the pharmacy bill the member's primary insurance first, if this is incorrect pt needs to contact [employer] at [phone].

## 2023-11-09 NOTE — Telephone Encounter (Signed)
 Pt reports she does not have other insurance, just Medicaid. Provided her the # to call.

## 2024-05-30 ENCOUNTER — Ambulatory Visit: Admitting: Psychiatry
